# Patient Record
Sex: Female | Born: 1980 | Race: Black or African American
Health system: Southern US, Community
[De-identification: ages and names within clinical notes are randomized; demographics above are authoritative.]

## PROBLEM LIST (undated history)

## (undated) DIAGNOSIS — M2550 Pain in unspecified joint: Secondary | ICD-10-CM

## (undated) DIAGNOSIS — G43909 Migraine, unspecified, not intractable, without status migrainosus: Secondary | ICD-10-CM

## (undated) DIAGNOSIS — F32A Depression, unspecified: Secondary | ICD-10-CM

## (undated) HISTORY — DX: Pain in unspecified joint: M25.50

## (undated) HISTORY — DX: Depression, unspecified: F32.A

## (undated) HISTORY — DX: Migraine, unspecified, not intractable, without status migrainosus: G43.909

---

## 2016-01-22 ENCOUNTER — Encounter (INDEPENDENT_AMBULATORY_CARE_PROVIDER_SITE_OTHER): Payer: Self-pay | Admitting: Nurse Practitioner

## 2016-01-22 ENCOUNTER — Ambulatory Visit (INDEPENDENT_AMBULATORY_CARE_PROVIDER_SITE_OTHER): Payer: BC Managed Care – PPO | Admitting: Nurse Practitioner

## 2016-01-22 ENCOUNTER — Ambulatory Visit (INDEPENDENT_AMBULATORY_CARE_PROVIDER_SITE_OTHER): Payer: BC Managed Care – PPO

## 2016-01-22 VITALS — BP 138/87 | HR 80 | Temp 99.1°F | Resp 14 | Ht 63.0 in | Wt 188.0 lb

## 2016-01-22 DIAGNOSIS — R0789 Other chest pain: Secondary | ICD-10-CM

## 2016-01-22 DIAGNOSIS — R079 Chest pain, unspecified: Secondary | ICD-10-CM

## 2016-01-22 MED ORDER — ALBUTEROL SULFATE HFA 108 (90 BASE) MCG/ACT IN AERS
2.0000 | INHALATION_SPRAY | Freq: Four times a day (QID) | RESPIRATORY_TRACT | Status: AC | PRN
Start: 2016-01-22 — End: ?

## 2016-01-22 MED ORDER — PREDNISONE 20 MG PO TABS
40.0000 mg | ORAL_TABLET | Freq: Every day | ORAL | Status: AC
Start: 2016-01-22 — End: 2016-01-27

## 2016-01-22 MED ORDER — ALBUTEROL SULFATE (2.5 MG/3ML) 0.083% IN NEBU
2.5000 mg | INHALATION_SOLUTION | Freq: Once | RESPIRATORY_TRACT | Status: AC
Start: 2016-01-22 — End: 2016-01-22
  Administered 2016-01-22: 2.5 mg via RESPIRATORY_TRACT

## 2016-01-22 NOTE — Patient Instructions (Signed)
Bronchospasm (Adult)    Bronchospasm occurs when the airways (bronchial tubes) go into spasm and contract. This makes it hard to breathe and causes wheezing (a high-pitched whistling sound). Bronchospasm can also cause frequent coughing without wheezing.  Bronchospasm is due to irritation, inflammation, or allergic reaction of the airways. People with asthma get bronchospasm. However, not everyone with bronchospasm has asthma.  Being exposed to harmful fumes, a recent case of bronchitis, exercise, or a flare-up of chronic obstructive pulmonary disease (COPD) may cause the airways to spasm. An episode of bronchospasm may last 7 to 14 days. Medicine may be prescribed to relax the airways and prevent wheezing. Antibiotics will be prescribed only if your healthcare provider thinks there is a bacterial infection. Antibiotics do not help a viral infection.  Home care   Drink lots of water or other fluids (at least 10 glasses a day) during an attack. This will loosen lung secretions and make it easier to breathe. If you have heart or kidney disease, check with your doctor before you drink extra fluids.   Take prescribed medicine exactly at the times advised. If you take an inhaled medicine to help with breathing, do not use it more than once every 4 hours, unless told to do so. If prescribed an antibiotic or prednisone, take all of the medicine, even if you are feeling better after a few days.   Do not smoke. Also avoid being exposed to secondhand smoke.   If you were given an inhaler, use it exactly as directed. If you need to use it more often than prescribed, your condition may be getting worse. Contact your healthcare provider.  Follow-up care  Follow up with your healthcare provider, or as advised.  Note: If you are age 65 or older, have a chronic lung disease or condition that affects your immune system, or you smoke, we recommend getting pneumococcal vaccinations, as well as an influenza vaccination (flu shot)  every autumn. Ask your healthcare provider about this.  When to seek medical advice  Call your healthcare provider right away if any of these occur:   You need to use your inhalers more often than usual.   You develop a fever of 100.4F (38C) or higher.   You are coughing up lots of dark-colored sputum (mucus).   You do not start to improve within 24 hours.  Call 911, or get immediate medical care  Contact emergency services if any of these occur:   Coughing up bloody sputum (mucus)   Chest pain with each breath   Increased wheezing or shortness of breath  Date Last Reviewed: 07/14/2014   2000-2016 [employer] [address], [place] [address]. All rights reserved. This information is not intended as a substitute for professional medical care. Always follow your healthcare professional's instructions.

## 2016-01-22 NOTE — Progress Notes (Signed)
[facility]  PROGRESS NOTE     Patient: Stephanie Hurley   Date: 01/22/2016   MRN: [id number]       Stephanie Hurley is a 35 y.o. female      SUBJECTIVE     Chief Complaint   Patient presents with   . Chest Pain     Left chest pain for a week, spread to the Right lateral side and Right shoulder uncomfortable. Spit off w/ blood few days ago.   Has had chest pain for a few weeks. Painful to cough and move. Tender to touch. Breath feels short sometimes. Also has a cough that has been for a week. No hx of blood clots or clotting disorders. No recent long travel. No asthma. No previous lung issues/PNA. Non smoker. Patient just got out of the [employer] recently. [place]      Chest Pain   This is a new problem. The current episode started 1 to 4 weeks ago. The onset quality is undetermined. The problem occurs intermittently. The problem has been gradually worsening. The pain is present in the substernal region and lateral region. The pain is moderate. The pain radiates to the left shoulder, right shoulder, upper back and mid back. Associated symptoms include a cough and shortness of breath. Pertinent negatives include no diaphoresis, dizziness, exertional chest pressure, fever, irregular heartbeat, lower extremity edema, nausea, near-syncope, palpitations, sputum production, syncope or vomiting. She has tried nothing for the symptoms. The treatment provided no relief.       Review of Systems   Constitutional: Negative for fever, chills, diaphoresis and fatigue.   Respiratory: Positive for cough, chest tightness and shortness of breath. Negative for sputum production.    Cardiovascular: Positive for chest pain. Negative for palpitations, syncope and near-syncope.   Gastrointestinal: Negative for nausea and vomiting.   Neurological: Negative for dizziness.   All other systems reviewed and are negative.      The following portions of the patient's history were reviewed and  updated as appropriate: Allergies, Current Medications, Past Family History, Past Medical history, Past social history, Past surgical history, and Problem List.    OBJECTIVE     Vitals   Filed Vitals:    01/22/16 1807   BP: 138/87   Pulse: 80   Temp: 99.1 F (37.3 C)   Resp: 14   Height: 1.6 m (5\' 3" )   Weight: 85.276 kg (188 lb)       Physical Exam   Constitutional: She is oriented to person, place, and time. She appears well-developed and well-nourished. No distress.   HENT:   Right Ear: External ear normal.   Left Ear: External ear normal.   Mouth/Throat: Oropharynx is clear and moist.   Eyes: Conjunctivae are normal. Right eye exhibits no discharge. Left eye exhibits no discharge.   Neck: Neck supple.   Cardiovascular: Normal rate, regular rhythm and normal heart sounds.    Pulmonary/Chest: Effort normal and breath sounds normal. No respiratory distress. She has no wheezes. She has no rales. She exhibits tenderness.       Lymphadenopathy:     She has no cervical adenopathy.   Neurological: She is alert and oriented to person, place, and time.   Skin: Skin is warm and dry. She is not diaphoretic.   Psychiatric: She has a normal mood and affect.       Lab Results (24 Hour)   Results     **  No results found for the last 24 hours. **          Radiology Results (24 Hour)     Procedure Component Value Units Date/Time    X-ray chest PA and lateral [[id number]] Collected:  01/22/16 1958    Order Status:  Completed Updated:  01/22/16 2003    Narrative:      HISTORY: chest pain/cough    TECHNIQUE: PA and lateral views of the chest were obtained.     COMPARISON: None.    FINDINGS:  The lungs are clear without focal consolidation. There are no  pleural effusions.  The cardiomediastinal contours are within normal  limits.  No pneumothorax is seen.       Impression:       No evidence of acute cardiopulmonary abnormality.    Gustavus Messing, MD   01/22/2016 7:59 PM            ASSESSMENT     Encounter Diagnoses   Name Primary?   .  Chest pain, unspecified type Yes   . Chest tightness         PLAN     Procedures    1. Chest pain, unspecified type  - Task for ECG    Albuterol neb given here - patient reports improvement in symptoms   CD of images given  Albuterol PRN  Prednisone for 5 days  Follow up with PCP - names given  ECG - copy given, NSR, no ectopy, no ST elevation   An After Visit Summary was printed and given to the patient.      Signed,  Herschel Senegal, NP  01/22/2016

## 2016-03-01 ENCOUNTER — Inpatient Hospital Stay: Payer: Non-veteran care

## 2016-03-01 VITALS — BP 128/81 | HR 61

## 2016-03-01 DIAGNOSIS — M25561 Pain in right knee: Secondary | ICD-10-CM | POA: Insufficient documentation

## 2016-03-01 DIAGNOSIS — M25562 Pain in left knee: Secondary | ICD-10-CM | POA: Insufficient documentation

## 2016-03-01 NOTE — PT/OT Plan of Care (Signed)
Plan of Care IPTC Medicare Provider #: [phone]                Patient Name: Stephanie Hurley  MRN: [id number]  DOI: Onset of Problem / Injury: 02/25/16 DOS: N/A  SOC:03/01/2016    Diagnosis:     ICD-10-CM    1. Acute pain of both knees M25.561     M25.562        ASSESSMENT: the patient is a 35 y.o. female presenting with bilateral knee pain R>L who requires Physical Therapy for the following:  Impairments:   Observation of posture: Deficits noted: Pes Planus bilateral  Gait: decreased hip extension bilateral and decreased knee flexion with swing phase bilateral, ER on L  Functional Strength:   Sit to Stand: Independent  Squat:  Partial; pain on lateral L knee    Balance:  SLS R: Eyes Open (EO): 12 Sec. Burning in posterior knee   SLS L: Eyes Open (EO): 10 sec.  Initial   R   R LE Strength  MMT /5 Initial  L    L   4  Hip Flexion 4+    4+  Hip Extension 4+    4  Hip Abduction 4+    4-  Hip Adduction 4+    4  Quadriceps 4    4  Hamstrings 4    (blank fields were intentionally left blank)  Muscle shaking in quads and hamstrings  Range of Motion: (degrees)  Initial Right  AROM InitialRight  PROM   Right  AROM   Right PROM Knee InitialLeft AROM InitialLeft PROM   Left AROM   Left PROM   120    Flexion 110      3 deg hyper    Extension 5 deg flex      (blank fields were intentionally left blank)  Palpation: Pain to palpation: rectus femoris, vastus lateralis, vastus medialis, iliopsoas, adductors   Patellar Mobility: Within Functional Limits Direction: superior, inferior, medial and lateral  **Tenderness to medial joint line bilaterally  Flexibility:    Comment:   Hamstrings Restricted Bilateral R:59 from neutral p! In R hip  L: 42 from neutral (as measured in 90/90)   Gastroc Restricted Bilateral          Barriers to Rehabilitations/Comorbidities/personal  factors:  Time since onset of injury/illness/exacerbation h/o knee pain since 2013  Comorbidities labral tear of R hip    Pain located: Bilateral knees,  R>L    Clinical presentation: stable     Functional Limitations (PLOF):She is currently having clicking and popping in her knees (R>L) when walking and running and when sitting or getting up from a chair (no discomfort when walking 1+ miles, sitting for 3+ hrs).  Patient is avoiding stairs right now because it is too painful (negotiating stairs everyday).  She is also avoiding wearing heels at this time because it is too painful on her R knee (wearing heels regularly).         Plan Of Care: Body Mechanics Education, NMR, Proprioceptive Activites, Instruction in HEP, Ultrasound, Therapeutic Exercise, Balance/Gait training and Soft Tissue/Joint Mobilization tib-fem gr 1-4 in all directions as needed    Frequency/Duration: 2 times a week for 16 sessions. Certification Status Ends: 05/28/16    Goals:  Date (Body Area, Impairment Goal, Functional   Activity, Target Performance) Time Frame Status Date/  Initial   03/01/2016   Patient will demonstrate independence in prescribed HEP with proper form, sets and reps for safe  discharge to an independent program.  16 sessions Initial Eval    03/01/2016   Increase single leg stance to 30 seconds so patient can negotiate curbs and uneven surfaces safely.  16 sessions Initial Eval    03/01/2016   Increase hip abduction and extensor strength 5/5 to allow for ambulation > 60 minutes.  16 sessions Initial Eval    03/01/2016   Increase knee flexion AROM to 120 degrees bilaterally to allow patient to safely negotiate stairs.  16 sessions Initial Eval    03/01/2016 Patient will improve hamstring flexibility to lacking 40 degrees from neutral or less bilaterally in order to decrease pain when sitting down.  16   sessions Initial Eval      Signature: Leanne Lovely, PT, DPT [place] [facility] Date: 03/04/2016    Signature: Otis Dials, MD _______________________  Date:  ________________     Patient Name: Stephanie Hurley  MRN: [id number]

## 2016-03-01 NOTE — PT/OT Exercise Plan (Signed)
Name: Freddi Che  Referring Physician: Otis Dials, MD  Diagnosis:     ICD-10-CM    1. Acute pain of both knees M25.561     M25.562         Precautions:  H/O labral tear of R hip Date of Surgery:   N/A MD Follow-up: TBD          Exercise Flow Sheet    Exercise Specifics 03/01/2016               Warm Up Treadmill >             Active HS stretch    >               Hip flexor stretch  >             Quad stretch    >                                                                                                                                    Home Exercise Program                 (Initials = supervised exercise by clinician)

## 2016-03-01 NOTE — PT/OT Therapy Note (Signed)
INITIAL EVALUATION (Knee)    Name: Stephanie Hurley Age: 35 y.o. Occupation: Buyer, retail SOC:03/01/2016  Referring Physician: Otis Dials, MD MD recheck: 05/25/16 DOS:  N/A DOI: Onset of Problem / Injury: 02/25/16  # of Authorized Visits: 12 Visit # 1    Diagnosis (Treating/Medical):     ICD-10-CM    1. Acute pain of both knees M25.561     M25.562         SUBJECTIVE: Patient recalls that she landed wrong on her R leg when performing a paratrooper jump back in 2013 which she felt her R leg twisted wrong.  She is currently having clicking and popping in her knees (R>L) when walking and running and when sitting or getting up from a chair.  Patient is avoiding stairs right now because it is too painful.  She is also avoiding wearing heels at this time because it is too painful on her R knee.  Patient had increased swelling in both legs when driving 3.5 hours last weekend which went away the next day when she woke up.  She had pain around her ankles when she had the swelling.  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        Mechanism of Injury: Paratrooper jump landing    Patient's reason for seeking PT /Functional Limitations (PLOF): See POC    Past Medical History:   Past Medical History   Diagnosis Date   . Depression    . Migraine      Medications: No outpatient prescriptions have been marked as taking for the 03/01/16 encounter (Clinical Support) with Leanne Lovely, PT.        Other Treatment/Prior Therapy: Yes, in 2014  Prior Hospitalization: No  Hand Dominance: Dominant Hand: Right Involved Side: Involved Side: Right   DiagnosticTests: X-rays of bilateral knees last week    Outcome Measure:               LEFS L Score: 36 LEFS R Score: 36                        % Pain Score: 70% Rate Satisfaction with Current Function: 3/10   Living Environment: Type of Residence: Multi-story home      Dwelling Entrance:     Patient lives with: Living Arrangements: Spouse/significant  other    OBJECTIVE:    Vitals: BP: 128/81 mmHg Heart Rate: 61      Observation/Posture/Gait/Integumentary:  Observation of posture: Deficits noted: Pes Planus bilateral  Ambulation: without AD  Gait: decreased hip extension bilateral and decreased knee flexion with swing phase bilateral, ER on L  Functional Strength:   Sit to Stand: Independent  Squat:  Partial; pain on lateral L knee    Balance:  SLS R: Eyes Open (EO): 12 Sec. Burning in posterior knee   SLS L: Eyes Open (EO): 10 sec.     Range of Motion: (degrees)  Initial Right  AROM InitialRight  PROM   Right  AROM   Right PROM Knee InitialLeft AROM InitialLeft PROM   Left AROM   Left PROM   120    Flexion 110      3 deg hyper    Extension 5 deg flex      (blank fields were intentionally left blank)    Hip AROM: WFL and Limitations Right hip flexion laterally deviated due to labral tear  Ankle AROM: [facility]    Initial  R   R LE Strength  MMT /5 Initial  L    L   4  Hip Flexion 4+    4+  Hip Extension 4+    4  Hip Abduction 4+    4-  Hip Adduction 4+    4  Quadriceps 4    4  Hamstrings 4      Ankle Dorsiflexion       Ankle Plantarflexion     (blank fields were intentionally left blank)  Muscle shaking in quads and hamstrings  Girth/Edema: None noted   Initial R  Initial L   Suprapatellar      Mid Patellar      Infrapatellar      10 cm. Prox.      10 cm. Dist.      (blank fields were intentionally left blank)    Integumentary: No wound, lesion or rash noted  Palpation: Pain to palpation: rectus femoris, vastus lateralis, vastus medialis, iliopsoas, adductors   Patellar Mobility: Within Functional Limits Direction: superior, inferior, medial and lateral  **Tenderness to medial joint line bilaterally    Flexibility:    Comment:   Hamstrings Restricted Bilateral R:59 from neutral p! In R hip  L: 42 from neutral (as measured in 90/90)   Quadriceps NT    Piriformis NT    ITBand NT    Iliopsoas NT    Gastroc Restricted Bilateral        Special Tests/Neurological Screen:      R L  R L   Lachmans NT NT Apley's Test NT NT   Anterior Drawer NT NT Thomas Test NT NT   Posterior Drawer NT NT Obers Test NT NT   Valgus Stress NT NT SLR NT NT   Varus Stress NT NT Thessalys (-) (-)   McMurray's Test NT NT          Deep Tendon Reflex R L   L3-L4 Patella NT NT   S1 Achilles NT NT    NT NT     Sensation to Light touch: Intact    Treatment Initial Visit:  Evaluation   Patient Education on the goals of physical therapy, focusing on increasing muscle strength in order to decrease muscle imbalances and increasing flexibility.   Therapeutic exercise with instruction in HEP and provided patient written and illustrated handout No  Therapeutic Activity: N/A  Manual: N/A  Modalities: None  Rehab Potential:good  Is patient aware of diagnosis: Yes  For Next Visit Add Assess Thomas test, manual therapy to quads and hamstrings; continue assessing medial joint line, start Therex    Leanne Lovely, PT, DPT [place] [facility]  03/02/2016    Total Treatment (billable) Time:  10'  Total Timed Minutes:  0'

## 2016-03-02 ENCOUNTER — Inpatient Hospital Stay: Payer: Non-veteran care

## 2016-03-02 ENCOUNTER — Inpatient Hospital Stay: Payer: BC Managed Care – PPO

## 2016-03-02 DIAGNOSIS — M25561 Pain in right knee: Secondary | ICD-10-CM | POA: Insufficient documentation

## 2016-03-02 DIAGNOSIS — M25562 Pain in left knee: Secondary | ICD-10-CM | POA: Insufficient documentation

## 2016-03-02 NOTE — PT/OT Therapy Note (Signed)
DAILY NOTE   03/02/2016        Total Treatment (billable)Time: 45'  Total Timed Minutes:  60' Visit Number:  2/16    Payor: OUT OF STATE BLUE CROSS / Plan: BCBS OUT OF STATE / Product Type: *No Product type* /    # of Authorized Visits: 12 Visit #: 1      Diagnosis (Treating/Medical):     ICD-10-CM    1. Acute pain of both knees M25.561     M25.562            Subjective:  Iza's pain is Constant/continuous and Increases with movement and is rated a 3/10.  Functional Status: Pt states she still has pain in her knees.  She said she was just evaluated yesterday, so no real changes, as of yet.    Objective:   Treatment:  Therapeutic Exercise: to improve: Flexibility/ROM, Stabilization and Strength   Warm-up:  4' on bike for B knee ROM and prepare for MT  Modifications/Patient Education: Instructed pt with HEP and provided handout per [employer] for active HS stretches and standing IT band stretches. Verbal cues for correct body alignment with IT band stretches.  Attempted supine IT band stretch but this aggravated her knee pain.    Precautions:   Date of Surgery:    MD Follow-up:           Exercise Flow Sheet    Exercise Specifics 03/01/2016 03/02/16            Bike     4'  pc            Active HS stretches     3x15"  bilat  pc                 Supine and Standing  IT Band stretches  3x15"  Bilat  pc                                                                                                                                                    Home Exercise Program     Provided HEP handout  pc            (Initials = supervised exercise by clinician)        NMR:  None    Therapeutic Activities:  None      Manual Therapy:   Long sit:   STM/DTM to bilat quads, lateral knee retinaculum, and patellar tendons   Patellar mobs  Side Lying (each):   DTM to TFL, gluts, and IT bands      Initial Evaluation Reference and/orCurrent Measurements (ROM, Strength, Girth, Outcomes, etc.):   03/02/16: None, second session       Modalities:  Ice Pack 10 min. Location bilat knees Position Seated  Therapy Rationale: Decrease Pain, Decrease Inflammation, Decrease Edema and Increase  Extensiblility       Assessment (response to treatment):   Increased tone of bilateral hips and IT bands with TTP along the lateral knee joint line and at the inferior patellar tendon with L>R.  Hypomobile patellar. Focused on tissue tightness today but pt will benefit from stability and strengthening of bilateral hips and knees.    Progress towards functional goals: Second session, goals are ongoing.    Frequency/Duration: 2 times a week for 16 sessions. Certification Status Ends: 05/28/16    Goals:  Date (Body Area, Impairment Goal, Functional   Activity, Target Performance) Time Frame Status Date/  Initial   03/01/2016   Patient will demonstrate independence in prescribed HEP with proper form, sets and reps for safe discharge to an independent program.  16 sessions Initial Eval    03/01/2016   Increase single leg stance to 30 seconds so patient can negotiate curbs and uneven surfaces safely.  16 sessions Initial Eval    03/01/2016   Increase hip abduction and extensor strength 5/5 to allow for ambulation > 60 minutes.  16 sessions Initial Eval    03/01/2016   Increase knee flexion AROM to 120 degrees bilaterally to allow patient to safely negotiate stairs.  16 sessions Initial Eval    03/01/2016 Patient will improve hamstring flexibility to lacking 40 degrees from neutral or less bilaterally in order to decrease pain when sitting down.  16   sessions Initial Eval        Patient requires continued skilled care to: improve functional mobility and stability for walking, rec stair negotiation, and transitional movements with decreased pain.    Plan:  Continue with Plan of Care    Evlyn Kanner. [place], [place] [id number]  03/02/2016        EVAL INFO:  SUBJECTIVE: Patient recalls that she landed wrong on her R leg when performing a paratrooper jump back in 2013 which she felt her R leg  twisted wrong.  She is currently having clicking and popping in her knees (R>L) when walking and running and when sitting or getting up from a chair.  Patient is avoiding stairs right now because it is too painful.  She is also avoiding wearing heels at this time because it is too painful on her R knee.  Patient had increased swelling in both legs when driving 3.5 hours last weekend which went away the next day when she woke up.  She had pain around her ankles when she had the swelling.  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        Observation/Posture/Gait/Integumentary:  Observation of posture: Deficits noted: Pes Planus bilateral  Ambulation: without AD  Gait: decreased hip extension bilateral and decreased knee flexion with swing phase bilateral, ER on L    Balance:  SLS R: Eyes Open (EO): 12 Sec. Burning in posterior knee   SLS L: Eyes Open (EO): 10 sec.     Range of Motion: (degrees)  Initial Right  AROM InitialRight  PROM   Right  AROM   Right PROM Knee InitialLeft AROM InitialLeft PROM   Left AROM   Left PROM   120    Flexion 110      3 deg hyper    Extension 5 deg flex      (blank fields were intentionally left blank)    Hip AROM: WFL and Limitations Right hip flexion laterally deviated due to labral tear  Ankle AROM: [facility]    Initial  R   R LE Strength  MMT /5 Initial  L    L   4  Hip Flexion 4+    4+  Hip Extension 4+    4  Hip Abduction 4+    4-  Hip Adduction 4+    4  Quadriceps 4    4  Hamstrings 4      Ankle Dorsiflexion       Ankle Plantarflexion     (blank fields were intentionally left blank)      Flexibility:    Comment:   Hamstrings Restricted Bilateral R:59 from neutral p! In R hip  L: 42 from neutral (as measured in 90/90)   Quadriceps NT    Piriformis NT    ITBand NT    Iliopsoas NT    Gastroc Restricted Bilateral        Special Tests/Neurological Screen:     R L  R L   Lachmans NT NT Apley's Test NT NT   Anterior Drawer NT NT [employer] NT NT   Posterior Drawer NT NT Obers Test  NT NT   Valgus Stress NT NT SLR NT NT   Varus Stress NT NT Thessalys (-) (-)   McMurray's Test NT NT

## 2016-03-02 NOTE — PT/OT Exercise Plan (Signed)
Name: Stephanie Hurley  Referring Physician: Otis Dials, MD  Diagnosis:     ICD-10-CM    1. Acute pain of both knees M25.561     M25.562         Precautions:   Date of Surgery:    MD Follow-up:           Exercise Flow Sheet    Exercise Specifics 03/01/2016 03/02/16            Bike     4'  pc            Active HS stretches     3x15"  bilat  pc                 Supine and Standing  IT Band stretches  3x15"  Bilat  pc                                                                                                                                                    Home Exercise Program                 (Initials = supervised exercise by clinician)

## 2016-03-09 ENCOUNTER — Inpatient Hospital Stay: Payer: Non-veteran care

## 2016-03-09 DIAGNOSIS — M25562 Pain in left knee: Secondary | ICD-10-CM | POA: Insufficient documentation

## 2016-03-09 DIAGNOSIS — M25561 Pain in right knee: Secondary | ICD-10-CM
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
## 2016-03-11 ENCOUNTER — Inpatient Hospital Stay: Payer: Non-veteran care

## 2016-03-11 DIAGNOSIS — M25561 Pain in right knee: Secondary | ICD-10-CM | POA: Insufficient documentation

## 2016-03-11 DIAGNOSIS — M25562 Pain in left knee: Secondary | ICD-10-CM | POA: Insufficient documentation
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
## 2016-03-16 ENCOUNTER — Inpatient Hospital Stay: Payer: Non-veteran care

## 2016-03-16 DIAGNOSIS — M25562 Pain in left knee: Secondary | ICD-10-CM | POA: Insufficient documentation

## 2016-03-16 DIAGNOSIS — M25561 Pain in right knee: Secondary | ICD-10-CM
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
## 2016-03-18 ENCOUNTER — Inpatient Hospital Stay: Payer: Non-veteran care

## 2016-03-18 DIAGNOSIS — M25562 Pain in left knee: Secondary | ICD-10-CM | POA: Insufficient documentation

## 2016-03-18 DIAGNOSIS — M25561 Pain in right knee: Secondary | ICD-10-CM | POA: Insufficient documentation

## 2016-03-18 NOTE — PT/OT Therapy Note (Signed)
DAILY NOTE   03/18/2016        Total Treatment (billable)Time: 45'  Total Timed Minutes:  44' Visit Number:  6/16    Payor: [employer] / Plan: [employer] / Product Type: *No Product type* /    # of Authorized Visits: 12 Visit #: 5      Diagnosis (Treating/Medical):     ICD-10-CM    1. Acute pain of both knees M25.561     M25.562            Subjective:  Stephanie Hurley states she continues to have in both knees that is Constant/continuous and Increases with movement. Right knee is worse at 6/10 with pain on the anterior and posterior aspects; and left 410.  Pt states her entire body feels achy today. Having increased pain in both feet but she started rolling a tennis ball under her feet and this seemed to help.  Pt has an appointment with a podiatrist in June.  Functional Status: Pt reports increased pain after increased activity with an evacuation drill at work today, using the stairs to evacuate and walking a long distance for the meet-up point.      Objective:   Treatment:  Therapeutic Exercise: to improve: Flexibility/ROM, Stabilization and Strength   Warm-up:  Held due to increased pain  Modifications/Patient Education: Progressed gym therex with Verbal cues for correct body alignment and core activation.        Precautions:   Date of Surgery:    MD Follow-up:           Exercise Flow Sheet    Exercise Specifics 03/01/2016 03/02/16 03/09/16 03/11/16 03/16/16 03/18/16        Bike     4'  pc 5'  pc 6'  pc 5'  pc Held        Active HS stretches     3x15"  bilat  pc 2x30"  pc TA activation with march and LAQ  1' ea  pc Quarter wall slides with RTB  At hips and green ball at back  10x5"  pc Sit to stand  RTB at thighs  10x  pc             Supine and Standing  IT Band stretches  3x15"  Bilat  pc Standing  2x30"  pc Tandem walking  1x Hall  pc Tandem walking  1x Hall  pc Tandem walking  1x Hall  pc              S/L clams  10x bilat  pc 15x bilat  pc > Supine clams   RTB  10x5"  pc              Side  Stepping  2x Hall  pc 2x Hall  pc RTB  1x Hall  pc >              Rocker board  1'  pc   1'  pc 1' ea  pc 1' ea  pc              3-way vectors  5x5"  bilat  pc   3-way vectors  5x5"  bilat  pc 3-way vectors  5x5"  bilat  pc >               LP   65#  10x  pc LP  70#  2x10  pc >  6"  Step-ups  10x bilat  pc >                                          Home Exercise Program     Provided HEP handout  pc            (Initials = supervised exercise by clinician)      NMR:     Stability and proprioception training with rocker board.   TA activation with neutral spine position for core stability while performing all therex    Therapeutic Activities:  None    Manual Therapy:   Long sit:   STM/DTM to bilat quads, lateral knee retinaculum, and patellar tendons   Patellar mobs  Prone:   STM/DFM  bilat TFL, gluts, and IT bands   STM to right HS     Initial Evaluation Reference and/orCurrent Measurements (ROM, Strength, Girth, Outcomes, etc.):     Outcomes  Initial Eval  03/18/16    LEFS  Right 36%;Left 36% Right 41%; Left 39%   PSFS 70% Right 53%; Left 40%   Satisfaction 3/10 6/10   HEP Participation         Range of Motion: (degrees)  Initial Right  AROM InitialRight  PROM 03/16/16  Right  AROM   Right PROM Knee InitialLeft AROM InitialLeft PROM   Left AROM   Left PROM   120  145  Flexion 110  137    3 deg hyper  0  Extension 5 deg flex  2    (blank fields were intentionally left blank)      03/09/16:  Timed SLS:  Right 45" (was 12"); Left 45" (was 10")    Modalities: Ice Pack 10 min. Location bilat knees Position Seated  Therapy Rationale: Decrease Pain, Decrease Inflammation, Decrease Edema and Increase Extensiblility       Assessment (response to treatment):   Improved outcomes since initial eval.  Pt continues to have increased bilateral knee pain with right > left.Ongoing TFL and IT band tightness, bilaterally.  Due to increased pain, she had difficulty with several CKC exercises.  Noted antalgic gait with  decreased stance phase on the RLE.    Progress towards functional goals: Improved outcomes.    Frequency/Duration: 2 times a week for 16 sessions. Certification Status Ends: 05/28/16    Goals:  Date (Body Area, Impairment Goal, Functional   Activity, Target Performance) Time Frame Status Date/  Initial   03/01/2016   Patient will demonstrate independence in prescribed HEP with proper form, sets and reps for safe discharge to an independent program.  16 sessions Initial Eval    03/01/2016   Increase single leg stance to 30 seconds so patient can negotiate curbs and uneven surfaces safely.  16 sessions Met 03/09/16  PC   03/01/2016   Increase hip abduction and extensor strength 5/5 to allow for ambulation > 60 minutes.  16 sessions Initial Eval    03/01/2016   Increase knee flexion AROM to 120 degrees bilaterally to allow patient to safely negotiate stairs.  16 sessions ProgressingR 145 and L 137 03/16/16  PC   03/01/2016 Patient will improve hamstring flexibility to lacking 40 degrees from neutral or less bilaterally in order to decrease pain when sitting down.  16   sessions Initial Eval        Patient requires continued skilled care to: improve  functional mobility and stability for walking, rec stair negotiation, and transitional movements with decreased pain.    Plan:  Continue with Plan of Care     Evlyn Kanner. [place], [place] UE#[id number]  03/18/2016        EVAL INFO:  SUBJECTIVE: Patient recalls that she landed wrong on her R leg when performing a paratrooper jump back in 2013 which she felt her R leg twisted wrong.  She is currently having clicking and popping in her knees (R>L) when walking and running and when sitting or getting up from a chair.  Patient is avoiding stairs right now because it is too painful.  She is also avoiding wearing heels at this time because it is too painful on her R knee.  Patient had increased swelling in both legs when driving 3.5 hours last weekend which went away the next day when she woke  up.  She had pain around her ankles when she had the swelling.  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        Observation/Posture/Gait/Integumentary:  Observation of posture: Deficits noted: Pes Planus bilateral  Ambulation: without AD  Gait: decreased hip extension bilateral and decreased knee flexion with swing phase bilateral, ER on L    Balance:  SLS R: Eyes Open (EO): 12 Sec. Burning in posterior knee   SLS L: Eyes Open (EO): 10 sec.     Range of Motion: (degrees)  Initial Right  AROM InitialRight  PROM   Right  AROM   Right PROM Knee InitialLeft AROM InitialLeft PROM   Left AROM   Left PROM   120    Flexion 110      3 deg hyper    Extension 5 deg flex      (blank fields were intentionally left blank)    Hip AROM: WFL and Limitations Right hip flexion laterally deviated due to labral tear  Ankle AROM: WFL    Initial   R   R LE Strength  MMT /5 Initial  L    L   4  Hip Flexion 4+    4+  Hip Extension 4+    4  Hip Abduction 4+    4-  Hip Adduction 4+    4  Quadriceps 4    4  Hamstrings 4      Ankle Dorsiflexion       Ankle Plantarflexion     (blank fields were intentionally left blank)      Flexibility:    Comment:   Hamstrings Restricted Bilateral R:59 from neutral p! In R hip  L: 42 from neutral (as measured in 90/90)   Quadriceps NT    Piriformis NT    ITBand NT    Iliopsoas NT    Gastroc Restricted Bilateral        Special Tests/Neurological Screen:     R L  R L   Lachmans NT NT Apley's Test NT NT   Anterior Drawer NT NT [employer] NT NT   Posterior Drawer NT NT Obers Test NT NT   Valgus Stress NT NT SLR NT NT   Varus Stress NT NT Thessalys (-) (-)   McMurray's Test NT NT

## 2016-03-23 ENCOUNTER — Inpatient Hospital Stay: Payer: Non-veteran care | Admitting: Rehabilitative and Restorative Service Providers"

## 2016-03-23 DIAGNOSIS — M25562 Pain in left knee: Secondary | ICD-10-CM | POA: Insufficient documentation

## 2016-03-23 DIAGNOSIS — M25561 Pain in right knee: Secondary | ICD-10-CM | POA: Insufficient documentation

## 2016-03-23 NOTE — PT/OT Therapy Note (Signed)
DAILY NOTE   03/23/2016        Total Treatment (billable)Time: 50'  Total Timed Minutes:  1' Visit Number:  7/16    Payor: [employer] / Plan: [employer] / Product Type: *No Product type* /    # of Authorized Visits: 12 Visit #: 7      Diagnosis (Treating/Medical):     ICD-10-CM    1. Acute pain of both knees M25.561     M25.562            Subjective:  Stephanie Hurley states she cont. To have pain in her knees L. > R. And its rated 7/10.   Functional Status: Pt reports pain with stairs and squats and the knee is very irritated since evacuation drill and its painful on L. Side.     Objective:   Treatment:  Therapeutic Exercise: to improve: Flexibility/ROM, Stabilization and Strength   Warm-up:  Held due to increased pain  Modifications/Patient Education: Progressed gym therex with Verbal cues for correct body alignment and core activation.        Precautions:   Date of Surgery:    MD Follow-up:           Exercise Flow Sheet    Exercise Specifics 03/01/2016 03/02/16 03/09/16 03/11/16 03/16/16 03/18/16        Bike     4'  pc 5'  pc 6'  pc 5'  pc Held        Active HS stretches     3x15"  bilat  pc 2x30"  pc TA activation with march and LAQ  1' ea  pc Quarter wall slides with RTB  At hips and green ball at back  10x5"  pc Sit to stand  RTB at thighs  10x  pc             Supine and Standing  IT Band stretches  3x15"  Bilat  pc Standing  2x30"  pc Tandem walking  1x Hall  pc Tandem walking  1x Hall  pc Tandem walking  1x Hall  pc              S/L clams  10x bilat  pc 15x bilat  pc > Supine clams   RTB  10x5"  pc              Side Stepping  2x Hall  pc 2x Hall  pc RTB  1x Hall  pc >              Rocker board  1'  pc   1'  pc 1' ea  pc 1' ea  pc              3-way vectors  5x5"  bilat  pc   3-way vectors  5x5"  bilat  pc 3-way vectors  5x5"  bilat  pc >               LP   65#  10x  pc LP  70#  2x10  pc >                6"  Step-ups  10x bilat  pc >                                          Home Exercise Program      Provided  HEP handout  pc            (Initials = supervised exercise by clinician)      NMR:     Mc. connel taping at L. Knee to improve patellar alignment and decrease pressure on Lat. Knee.    Therapeutic Activities:  None    Manual Therapy:   Long sit:   STM/DTM to bilat quads, lateral knee retinaculum, and patellar tendons   Patellar mobs in all planes at bilat knee.   Post. Glide at tibia gr 2 on L. Side.   Plunger use at lat. Retinaculum and distal IT band area.       Initial Evaluation Reference and/orCurrent Measurements (ROM, Strength, Girth, Outcomes, etc.):     Outcomes  Initial Eval  03/18/16    LEFS  Right 36%;Left 36% Right 41%; Left 39%   PSFS 70% Right 53%; Left 40%   Satisfaction 3/10 6/10   HEP Participation         Range of Motion: (degrees)  Initial Right  AROM InitialRight  PROM 03/16/16  Right  AROM   Right PROM Knee InitialLeft AROM InitialLeft PROM   Left AROM   Left PROM   120  145  Flexion 110  137    3 deg hyper  0  Extension 5 deg flex  2    (blank fields were intentionally left blank)      03/09/16:  Timed SLS:  Right 45" (was 12"); Left 45" (was 10")    Modalities: Ice Pack 10 min. Location bilat knees Position Seated.  PUS at L. Lat. Aspect of knee 1.0 w/cm2, 50 % duty cycle for 7 mins.   Therapy Rationale: Decrease Pain, Decrease Inflammation, Decrease Edema and Increase Extensiblility       Assessment (response to treatment):   Pt. Has increase in swelling and TTP at L. Lat. Knee and noted External tibial torsion and increase in weight bearing on outer aspect of foot bilat. Compensatory IR at femur bilat. Resulting in increase in strain on outer aspect of knee resulting in increase in pain. POssible lat. Meniscus tear but unable to perform special test due to recent flare up. Improved tissue extensibility noted after MT but cont. To have increase in sensitivity at L. Lat. Knee. Tol. Taping well.    Progress towards functional goals: Improved outcomes.    Frequency/Duration: 2 times  a week for 16 sessions. Certification Status Ends: 05/28/16    Goals:  Date (Body Area, Impairment Goal, Functional   Activity, Target Performance) Time Frame Status Date/  Initial   03/01/2016   Patient will demonstrate independence in prescribed HEP with proper form, sets and reps for safe discharge to an independent program.  16 sessions Initial Eval    03/01/2016   Increase single leg stance to 30 seconds so patient can negotiate curbs and uneven surfaces safely.  16 sessions Met 03/09/16  PC   03/01/2016   Increase hip abduction and extensor strength 5/5 to allow for ambulation > 60 minutes.  16 sessions Initial Eval    03/01/2016   Increase knee flexion AROM to 120 degrees bilaterally to allow patient to safely negotiate stairs.  16 sessions ProgressingR 145 and L 137 03/16/16  PC   03/01/2016 Patient will improve hamstring flexibility to lacking 40 degrees from neutral or less bilaterally in order to decrease pain when sitting down.  16   sessions Initial Eval        Patient requires continued skilled care  to: improve functional mobility and stability for walking, rec stair negotiation, and transitional movements with decreased pain.    Plan:  Continue with Plan of Care check response to taping.    Ilisa Hayworth A. Celine Mans, PT, [place] # [phone]    03/23/2016        EVAL INFO:  SUBJECTIVE: Patient recalls that she landed wrong on her R leg when performing a paratrooper jump back in 2013 which she felt her R leg twisted wrong.  She is currently having clicking and popping in her knees (R>L) when walking and running and when sitting or getting up from a chair.  Patient is avoiding stairs right now because it is too painful.  She is also avoiding wearing heels at this time because it is too painful on her R knee.  Patient had increased swelling in both legs when driving 3.5 hours last weekend which went away the next day when she woke up.  She had pain around her ankles when she had the swelling.  She has a history of a labral tear in  her right hip.        Observation/Posture/Gait/Integumentary:  Observation of posture: Deficits noted: Pes Planus bilateral  Ambulation: without AD  Gait: decreased hip extension bilateral and decreased knee flexion with swing phase bilateral, ER on L    Balance:  SLS R: Eyes Open (EO): 12 Sec. Burning in posterior knee   SLS L: Eyes Open (EO): 10 sec.     Range of Motion: (degrees)  Initial Right  AROM InitialRight  PROM   Right  AROM   Right PROM Knee InitialLeft AROM InitialLeft PROM   Left AROM   Left PROM   120    Flexion 110      3 deg hyper    Extension 5 deg flex      (blank fields were intentionally left blank)    Hip AROM: WFL and Limitations Right hip flexion laterally deviated due to labral tear  Ankle AROM: WFL    Initial   R   R LE Strength  MMT /5 Initial  L    L   4  Hip Flexion 4+    4+  Hip Extension 4+    4  Hip Abduction 4+    4-  Hip Adduction 4+    4  Quadriceps 4    4  Hamstrings 4      Ankle Dorsiflexion       Ankle Plantarflexion     (blank fields were intentionally left blank)      Flexibility:    Comment:   Hamstrings Restricted Bilateral R:59 from neutral p! In R hip  L: 42 from neutral (as measured in 90/90)   Quadriceps NT    Piriformis NT    ITBand NT    Iliopsoas NT    Gastroc Restricted Bilateral        Special Tests/Neurological Screen:     R L  R L   Lachmans NT NT Apley's Test NT NT   Anterior Drawer NT NT [employer] NT NT   Posterior Drawer NT NT Obers Test NT NT   Valgus Stress NT NT SLR NT NT   Varus Stress NT NT Thessalys (-) (-)   McMurray's Test NT NT

## 2016-03-24 ENCOUNTER — Inpatient Hospital Stay: Payer: Non-veteran care

## 2016-03-24 DIAGNOSIS — M25562 Pain in left knee: Secondary | ICD-10-CM | POA: Insufficient documentation

## 2016-03-24 DIAGNOSIS — M25561 Pain in right knee: Secondary | ICD-10-CM

## 2016-03-24 NOTE — PT/OT Therapy Note (Signed)
DAILY NOTE   03/24/2016        Total Treatment (billable)Time: 50'  Total Timed Minutes:  9' Visit Number: 8/16    Payor: [employer] / Plan: [employer]GRAM / Product Type: *No Product type* /    # of Authorized Visits: 12 Visit #: 7      Diagnosis (Treating/Medical):     ICD-10-CM    1. Acute pain of both knees M25.561     M25.562            Subjective:  Javanna rates both knees at 5/10 pain with L>R and pain mostly at the lateral aspects.   Functional Status: Pt could not feel any difference from the knee tape on her left knee and removed it this morning.  She states the pain is slightly better with both knees and feels like the [place] helped.  Pt continues to have pain with walking, squatting and prolonged sitting.     Objective:   Treatment:  Therapeutic Exercise: to improve: Flexibility/ROM, Stabilization and Strength   Warm-up:  Held due to increased pain  Modifications/Patient Education: Progressed gym therex with Verbal cues for correct body alignment and core activation.        Precautions:   Date of Surgery:    MD Follow-up:           Exercise Flow Sheet    Exercise Specifics 03/01/2016 03/02/16 03/09/16 03/11/16 03/16/16 03/18/16 03/24/16       Bike     4'  pc 5'  pc 6'  pc 5'  pc Held Held       Active HS stretches     3x15"  bilat  pc 2x30"  pc TA activation with march and LAQ  1' ea  pc Quarter wall slides with RTB  At hips and green ball at back  10x5"  pc Sit to stand  RTB at thighs  10x  pc No TB  10x  pc            Supine and Standing  IT Band stretches  3x15"  Bilat  pc Standing  2x30"  pc Tandem walking  1x Hall  pc Tandem walking  1x Hall  pc Tandem walking  1x Hall  pc Tandem walking  1x Hall  pc             S/L clams  10x bilat  pc 15x bilat  pc > Supine clams   RTB  10x5"  pc >             Side Stepping  2x Hall  pc 2x Hall  pc RTB  1x Hall  pc > Side stepping  1x Hall  pc             [employer]  1'  pc   1'  pc 1' ea  pc 1' ea  pc 1' ea  pc             3-way  vectors  5x5"  bilat  pc   3-way vectors  5x5"  bilat  pc 3-way vectors  5x5"  bilat  pc > HEP              LP   65#  10x  pc LP  70#  2x10  pc > >               6"  Step-ups  10x bilat  pc > Lateral step overs with 6"  step  10x  pc                                         Home Exercise Program     Provided HEP handout  pc            (Initials = supervised exercise by clinician)      NMR:     Rocker board and tandem walking for proprioception training    Therapeutic Activities:  None    Manual Therapy:   Long sit:   STM/DTM to bilat quads, lateral knee retinaculum, and patellar tendons   Patellar mobs in all planes at bilat knee.   Post. Glide at tibia gr 2 on L. Side.       Initial Evaluation Reference and/orCurrent Measurements (ROM, Strength, Girth, Outcomes, etc.):     Outcomes  Initial Eval  03/18/16    LEFS  Right 36%;Left 36% Right 41%; Left 39%   PSFS 70% Right 53%; Left 40%   Satisfaction 3/10 6/10   HEP Participation         Range of Motion: (degrees)  Initial Right  AROM InitialRight  PROM 03/16/16  Right  AROM   Right PROM Knee InitialLeft AROM InitialLeft PROM 03/16/16  Left AROM   Left PROM   120  145  Flexion 110  137    3 deg hyper  0  Extension 5 deg flex  2    (blank fields were intentionally left blank)      03/09/16:  Timed SLS:  Right 45" (was 12"); Left 45" (was 10")    Modalities: Ice Pack 10 min. Location bilat knees Position Seated.  PUS at L. Lat. Aspect of knee 1.0 w/cm2, 50 % duty cycle for 7 mins.   Therapy Rationale: Decrease Pain, Decrease Inflammation, Decrease Edema and Increase Extensiblility       Assessment (response to treatment):   Ongoing flare-up with swelling and TTP at Bilat lateral knee, L>R. Due to structural external tibial torsion, pt has increased strain on the lateral aspect of her knees, causing inflammation and muscular imbalances. Increased tone of the TFL and IT band bilaterally. Pt was able to perform therex without increased pain today.  She did c/o fatigue at end  of session.    Progress towards functional goals: Functional goals delayed due to flare-up.    Frequency/Duration: 2 times a week for 16 sessions. Certification Status Ends: 05/28/16    Goals:  Date (Body Area, Impairment Goal, Functional   Activity, Target Performance) Time Frame Status Date/  Initial   03/01/2016   Patient will demonstrate independence in prescribed HEP with proper form, sets and reps for safe discharge to an independent program.  16 sessions Initial Eval    03/01/2016   Increase single leg stance to 30 seconds so patient can negotiate curbs and uneven surfaces safely.  16 sessions Met 03/09/16  PC   03/01/2016   Increase hip abduction and extensor strength 5/5 to allow for ambulation > 60 minutes.  16 sessions Initial Eval    03/01/2016   Increase knee flexion AROM to 120 degrees bilaterally to allow patient to safely negotiate stairs.  16 sessions ProgressingR 145 and L 137 03/16/16  PC   03/01/2016 Patient will improve hamstring flexibility to lacking 40 degrees from neutral or less bilaterally in order to decrease pain when sitting down.  16  sessions Initial Eval        Patient requires continued skilled care to: improve functional mobility and stability for walking, rec stair negotiation, and transitional movements with decreased pain.    Plan:  Continue with Plan of Care ; Assess HS flexibility next session.    Stephanie Hurley [place], PT [place]# [id number]    03/24/2016        EVAL INFO:  SUBJECTIVE: Patient recalls that she landed wrong on her R leg when performing a paratrooper jump back in 2013 which she felt her R leg twisted wrong.  She is currently having clicking and popping in her knees (R>L) when walking and running and when sitting or getting up from a chair.  Patient is avoiding stairs right now because it is too painful.  She is also avoiding wearing heels at this time because it is too painful on her R knee.  Patient had increased swelling in both legs when driving 3.5 hours last weekend which  went away the next day when she woke up.  She had pain around her ankles when she had the swelling.  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        Observation/Posture/Gait/Integumentary:  Observation of posture: Deficits noted: Pes Planus bilateral  Ambulation: without AD  Gait: decreased hip extension bilateral and decreased knee flexion with swing phase bilateral, ER on L    Balance:  SLS R: Eyes Open (EO): 12 Sec. Burning in posterior knee   SLS L: Eyes Open (EO): 10 sec.     Range of Motion: (degrees)  Initial Right  AROM InitialRight  PROM   Right  AROM   Right PROM Knee InitialLeft AROM InitialLeft PROM   Left AROM   Left PROM   120    Flexion 110      3 deg hyper    Extension 5 deg flex      (blank fields were intentionally left blank)    Hip AROM: WFL and Limitations Right hip flexion laterally deviated due to labral tear  Ankle AROM: WFL    Initial   R   R LE Strength  MMT /5 Initial  L    L   4  Hip Flexion 4+    4+  Hip Extension 4+    4  Hip Abduction 4+    4-  Hip Adduction 4+    4  Quadriceps 4    4  Hamstrings 4      Ankle Dorsiflexion       Ankle Plantarflexion     (blank fields were intentionally left blank)      Flexibility:    Comment:   Hamstrings Restricted Bilateral R:59 from neutral p! In R hip  L: 42 from neutral (as measured in 90/90)   Quadriceps NT    Piriformis NT    ITBand NT    Iliopsoas NT    Gastroc Restricted Bilateral        Special Tests/Neurological Screen:     R L  R L   Lachmans NT NT Apley's Test NT NT   Anterior Drawer NT NT [employer] NT NT   Posterior Drawer NT NT Obers Test NT NT   Valgus Stress NT NT SLR NT NT   Varus Stress NT NT Thessalys (-) (-)   McMurray's Test NT NT

## 2016-03-31 ENCOUNTER — Inpatient Hospital Stay: Payer: Non-veteran care

## 2016-03-31 DIAGNOSIS — M25561 Pain in right knee: Secondary | ICD-10-CM | POA: Insufficient documentation

## 2016-03-31 DIAGNOSIS — M25562 Pain in left knee: Secondary | ICD-10-CM | POA: Insufficient documentation

## 2016-03-31 NOTE — PT/OT Therapy Note (Signed)
DAILY NOTE   03/31/2016        Total Treatment (billable)Time: 51'  Total Timed Minutes:  53' Visit Number: 9/16    Payor: [employer] / Plan: [employer] / Product Type: *No Product type* /    # of Authorized Visits: 12 Visit #: 9      Diagnosis (Treating/Medical):     ICD-10-CM    1. Acute pain of both knees M25.561     M25.562            Subjective:  Stephanie Hurley is currently having 4/10 pain in bilateral knees on the lateral aspect.  Patient is having the most pain after sitting for 4 hours at work.    Objective:   Treatment:  Therapeutic Exercise: to improve: Flexibility/ROM, Stabilization and Strength   Warm-up:  5 min unassisted  Modifications/Patient Education: Patient educated on performing HS contract/relax, foam rolling, and clamshells with CLX at home.   Progressed gym therex with Verbal cues for correct body alignment and core activation.        Precautions:   Date of Surgery:    MD Follow-up:           Exercise Flow Sheet    Exercise Specifics 03/01/2016 03/02/16 03/09/16 03/11/16 03/16/16 03/18/16 03/24/16 03/31/16      Bike     4'  pc 5'  pc 6'  pc 5'  pc Held Held 5' p!  EP      Active HS stretches     3x15"  bilat  pc 2x30"  pc TA activation with march and LAQ  1' ea  pc Quarter wall slides with RTB  At hips and green ball at back  10x5"  pc Sit to stand  RTB at thighs  10x  pc No TB  10x  pc Active HS stretch  x15 B  EP           Supine and Standing  IT Band stretches  3x15"  Bilat  pc Standing  2x30"  pc Tandem walking  1x Hall  pc Tandem walking  1x Hall  pc Tandem walking  1x Hall  pc Tandem walking  1x Hall  pc Tandem   Fwd/Bwd on airex beam  1' ea  EP            S/L clams  10x bilat  pc 15x bilat  pc > Supine clams   RTB  10x5"  pc > S/L with GTB  x10  B  EP              Side Stepping  2x Hall  pc 2x Hall  pc RTB  1x Hall  pc > Side stepping  1x Hall  pc >            Rocker board  1'  pc   1'  pc 1' ea  pc 1' ea  pc 1' ea  pc Foam Roller  1' VL/ITB  B  EP            3-way  vectors  5x5"  bilat  pc   3-way vectors  5x5"  bilat  pc 3-way vectors  5x5"  bilat  pc > HEP >             LP   65#  10x  pc LP  70#  2x10  pc > > >              6"  Step-ups  10x bilat  pc > Lateral step overs with 6" step  10x  pc >                                        Home Exercise Program     Provided HEP handout  pc            (Initials = supervised exercise by clinician)      NMR:     Tandem walking for proprioception training    Therapeutic Activities:  None    Manual Therapy:   Supine:   DTM: vastus lateralis B   TPR: vastus lateralis with quad set, B   STM: Tibialis anterior on R   TPR: tibialis anterior with DF/PF on R   Contract/relax to hamstrings B       Initial Evaluation Reference and/orCurrent Measurements (ROM, Strength, Girth, Outcomes, etc.):   Hamstring flexibility, see below  Outcomes  Initial Eval  03/18/16    LEFS  Right 36%;Left 36% Right 41%; Left 39%   PSFS 70% Right 53%; Left 40%   Satisfaction 3/10 6/10   HEP Participation     Flexibility:    Comment: 03/31/16   Hamstrings Restricted Bilateral R:59 from neutral p! In R hip  L: 42 from neutral (as measured in 90/90) 50 on R/35 after contract/relax; 32 deg on L/22 after contract/relax   Quadriceps NT     Piriformis NT     ITBand NT     Iliopsoas NT     Gastroc Restricted Bilateral       Range of Motion: (degrees)  Initial Right  AROM InitialRight  PROM 03/16/16  Right  AROM   Right PROM Knee InitialLeft AROM InitialLeft PROM 03/16/16  Left AROM   Left PROM   120  145  Flexion 110  137    3 deg hyper  0  Extension 5 deg flex  2    (blank fields were intentionally left blank)      03/09/16:  Timed SLS:  Right 45" (was 12"); Left 45" (was 10")    Modalities: None,. Pt denied.    Therapy Rationale: Decrease Pain, Decrease Inflammation, Decrease Edema and Increase Extensiblility       Assessment (response to treatment):   Patient with noted hypersensitivity to anterior tib bilaterally which may be due to peroneal nerve irritation, not  responding well to TPR on R so did not perform on L.  Patient with noted tightness in vastus lateralis bilaterally with significant improvement after TPR.  The pain in the bilaterally knees may be driven from quad and hamstring tightness as both were significantly tight and TTP. Patient requiring multiple verbal cues to stay below the hip during foam rolling to lateral quads.    Progress towards functional goals: Functional goals delayed due to flare-up.    Frequency/Duration: 2 times a week for 16 sessions. Certification Status Ends: 05/28/16    Goals:  Date (Body Area, Impairment Goal, Functional   Activity, Target Performance) Time Frame Status Date/  Initial   03/01/2016   Patient will demonstrate independence in prescribed HEP with proper form, sets and reps for safe discharge to an independent program.  16 sessions Progressing 03/31/16  EP   03/01/2016   Increase single leg stance to 30 seconds so patient can negotiate curbs and uneven surfaces safely.  16 sessions Met 03/09/16  PC   03/01/2016  Increase hip abduction and extensor strength 5/5 to allow for ambulation > 60 minutes.  16 sessions Initial Eval    03/01/2016   Increase knee flexion AROM to 120 degrees bilaterally to allow patient to safely negotiate stairs.  16 sessions ProgressingR 145 and L 137 03/16/16  PC   03/01/2016 Patient will improve hamstring flexibility to lacking 40 degrees from neutral or less bilaterally in order to decrease pain when sitting down.  16   sessions Progressing 03/31/16  EP       Patient requires continued skilled care to: improve functional mobility and stability for walking, rec stair negotiation, and transitional movements with decreased pain.    Plan:  Continue with Plan of Care ; Assess MMT next visit, F/U on foam rolling and clamshells with CLX  Leanne Lovely, PT, DPT [place] [phone]

## 2016-04-01 ENCOUNTER — Inpatient Hospital Stay: Payer: Non-veteran care

## 2016-04-06 ENCOUNTER — Inpatient Hospital Stay: Payer: Non-veteran care

## 2016-04-08 ENCOUNTER — Inpatient Hospital Stay: Payer: Non-veteran care

## 2016-04-08 DIAGNOSIS — M25562 Pain in left knee: Secondary | ICD-10-CM

## 2016-04-08 DIAGNOSIS — M25561 Pain in right knee: Secondary | ICD-10-CM | POA: Insufficient documentation

## 2016-04-08 NOTE — PT/OT Therapy Note (Signed)
DAILY NOTE   04/08/2016        Total Treatment (billable)Time: 45'  Total Timed Minutes:  81' Visit Number: 10/16    Payor: [employer] / Plan: [employer] / Product Type: *No Product type* /    # of Authorized Visits: 12 Visit #: 9      Diagnosis (Treating/Medical):     ICD-10-CM    1. Acute pain of both knees M25.561     M25.562            Subjective:  Stephanie Hurley is currently having 3/10 pain in bilateral knees on the lateral aspect. Pt states she has been using the foam roller at home and it really hurts, but she can see some improvement.  She went to the pool and did some exercises and felt really good afterward.    Objective:   Treatment:  Therapeutic Exercise: to improve: Flexibility/ROM, Stabilization and Strength   Warm-up:  5 min unassisted  Modifications/Patient Education: Reviewed HS contract/relax to work on at home.   Progressed gym therex with Verbal cues for correct body alignment and core activation.        Precautions:   Date of Surgery:    MD Follow-up:           Exercise Flow Sheet    Exercise Specifics 03/01/2016 03/02/16 03/09/16 03/11/16 03/16/16 03/18/16 03/24/16 03/31/16 56/8/17     Bike     4'  pc 5'  pc 6'  pc 5'  pc Held Held 5' p!  EP 5'  pc     Active HS stretches     3x15"  bilat  pc 2x30"  pc TA activation with march and LAQ  1' ea  pc Quarter wall slides with RTB  At hips and green ball at back  10x5"  pc Sit to stand  RTB at thighs  10x  pc No TB  10x  pc Active HS stretch  x15 B  EP Dynamic stretches  High knees, butt kicks, Frankies  1 lap ea  pc          Supine and Standing  IT Band stretches  3x15"  Bilat  pc Standing  2x30"  pc Tandem walking  1x Hall  pc Tandem walking  1x Hall  pc Tandem walking  1x Hall  pc Tandem walking  1x Hall  pc Tandem   Fwd/Bwd on airex beam  1' ea  EP Tandem   Fwd/Bwd on airex beam  1' ea  pc           S/L clams  10x bilat  pc 15x bilat  pc > Supine clams   RTB  10x5"  pc > S/L with GTB  x10  B  EP   >           Side Stepping  2x Hall  pc  2x Hall  pc RTB  1x Hall  pc > Side stepping  1x Hall  pc > Stool scoots  1/2 hall and back  pc           [employer]  1'  pc   1'  pc 1' ea  pc 1' ea  pc 1' ea  pc Foam Roller  1' VL/ITB  B  EP Foam Roller  1'  pc           3-way vectors  5x5"  bilat  pc   3-way vectors  5x5"  bilat  pc 3-way vectors  5x5"  bilat  pc > HEP > --            LP   65#  10x  pc LP  70#  2x10  pc > > > >             6"  Step-ups  10x bilat  pc > Lateral step overs with 6" step  10x  pc > 6" lateral step overs  1'  pc                                       Home Exercise Program     Provided HEP handout  pc            (Initials = supervised exercise by clinician)      NMR:     Tandem walking for proprioception training    Therapeutic Activities:  None    Manual Therapy:   Supine:   DTM: vastus lateralis B   STM: Tibialis anterior on R   Patellar mobs B at grade 4  B Side Lying:   DTM to IT band       Initial Evaluation Reference and/orCurrent Measurements (ROM, Strength, Girth, Outcomes, etc.):     Initial   R 6/8  R LE Strength  MMT /5 Initial  L 6/8   L   4 5 Hip Flexion 4+ 5   4+ 4+ Hip Extension 4+ 5   4 5  Hip Abduction 4+ 5   4- 5 Hip Adduction 4+ 5   4 5  Quadriceps 4 5   4 4  Hamstrings 4 4+     Ankle Dorsiflexion       Ankle Plantarflexion     (blank fields were intentionally left blank)        Hamstring flexibility, see below  Outcomes  Initial Eval  03/18/16    LEFS  Right 36%;Left 36% Right 41%; Left 39%   PSFS 70% Right 53%; Left 40%   Satisfaction 3/10 6/10   HEP Participation     Flexibility:    Comment: 03/31/16   Hamstrings Restricted Bilateral R:59 from neutral p! In R hip  L: 42 from neutral (as measured in 90/90) 50 on R/35 after contract/relax; 32 deg on L/22 after contract/relax   Quadriceps NT     Piriformis NT     ITBand NT     Iliopsoas NT     Gastroc Restricted Bilateral       Range of Motion: (degrees)  Initial Right  AROM InitialRight  PROM 03/16/16  Right  AROM   Right PROM Knee InitialLeft AROM InitialLeft PROM  03/16/16  Left AROM   Left PROM   120  145  Flexion 110  137    3 deg hyper  0  Extension 5 deg flex  2    (blank fields were intentionally left blank)      03/09/16:  Timed SLS:  Right 45" (was 12"); Left 45" (was 10")    Modalities: Ice Pack 10 min. Location bilat knees Position Seated in long sit  Therapy Rationale: Decrease Pain, Decrease Inflammation, Decrease Edema and Increase Extensiblility       Assessment (response to treatment):   Pt presents with lower pain level today.  Noted less sensitivity to touch at the lateral knee joints and along the IT band.  There was less tone at the IT band, as  well.  Pt has improved strength per MMT today.      Progress towards functional goals: Improved strength and standing tolerance.    Frequency/Duration: 2 times a week for 16 sessions. Certification Status Ends: 05/28/16    Goals:  Date (Body Area, Impairment Goal, Functional   Activity, Target Performance) Time Frame Status Date/  Initial   03/01/2016   Patient will demonstrate independence in prescribed HEP with proper form, sets and reps for safe discharge to an independent program.  16 sessions Progressing 03/31/16  EP   03/01/2016   Increase single leg stance to 30 seconds so patient can negotiate curbs and uneven surfaces safely.  16 sessions Met 03/09/16  PC   03/01/2016   Increase hip abduction and extensor strength 5/5 to allow for ambulation > 60 minutes.  16 sessions Progress with L met; R at 4+ 04/08/16  PC   03/01/2016   Increase knee flexion AROM to 120 degrees bilaterally to allow patient to safely negotiate stairs.  16 sessions ProgressingR 145 and L 137 03/16/16  PC   03/01/2016 Patient will improve hamstring flexibility to lacking 40 degrees from neutral or less bilaterally in order to decrease pain when sitting down.  16   sessions Progressing 03/31/16  EP       Patient requires continued skilled care to: improve functional mobility and stability for walking, rec stair negotiation, and transitional movements  with decreased pain.    Plan:  Continue with Plan of Care ; Will continue for 1-2 more sessions and D/C due to end of authorization period.    Evlyn Kanner April Colter, PT # [phone]

## 2016-04-13 ENCOUNTER — Inpatient Hospital Stay: Payer: Non-veteran care

## 2016-04-13 DIAGNOSIS — M25561 Pain in right knee: Secondary | ICD-10-CM

## 2016-04-13 DIAGNOSIS — M25562 Pain in left knee: Secondary | ICD-10-CM | POA: Insufficient documentation

## 2016-04-13 NOTE — PT/OT Therapy Note (Signed)
DAILY NOTE   04/13/2016        Total Treatment (billable)Time: 45'  Total Timed Minutes:  22' Visit Number: 11/16    Payor: [employer] / Plan: [employer] / Product Type: *No Product type* /    # of Authorized Visits: 12 Visit #: 10      Diagnosis (Treating/Medical):     ICD-10-CM    1. Acute pain of both knees M25.561     M25.562            Subjective:  Stephanie Hurley is currently having 4/10 pain in bilateral knees on the lateral aspect. Pt reports increased soreness after [place] 3 hours at the fair this past weekend.  Pt states she feels better overall.  She has been doing her HEP consistently, per pt.  Pt has an appointment with a rheumatologist on Thursday.      Objective:   Treatment:  Therapeutic Exercise: to improve: Flexibility/ROM, Stabilization and Strength   Warm-up:  4 min on ET with good tolerance.  Modifications/Patient Education:  Progressed gym therex with Verbal cues for correct body alignment and core activation.        Precautions:   Date of Surgery:    MD Follow-up:           Exercise Flow Sheet    Exercise Specifics 03/01/2016 03/02/16 03/09/16 03/11/16 03/16/16 03/18/16 03/24/16 03/31/16 04/08/16 04/13/16    Bike     4'  pc 5'  pc 6'  pc 5'  pc Held Held 5' p!  EP 5'  pc ET  4'  pc    Active HS stretches     3x15"  bilat  pc 2x30"  pc TA activation with march and LAQ  1' ea  pc Quarter wall slides with RTB  At hips and green ball at back  10x5"  pc Sit to stand  RTB at thighs  10x  pc No TB  10x  pc Active HS stretch  x15 B  EP Dynamic stretches  High knees, butt kicks, Frankies  1 lap ea  pc Dynamic stretches  High knees, butt kicks, Frankies  1 lap at regular pace and 1 lap at quicker pace  pc         Supine and Standing  IT Band stretches  3x15"  Bilat  pc Standing  2x30"  pc Tandem walking  1x Hall  pc Tandem walking  1x Hall  pc Tandem walking  1x Hall  pc Tandem walking  1x Hall  pc Tandem   Fwd/Bwd on airex beam  1' ea  EP Tandem   Fwd/Bwd on airex beam  1' ea  pc >          S/L  clams  10x bilat  pc 15x bilat  pc > Supine clams   RTB  10x5"  pc > S/L with GTB  x10  B  EP   > >          Side Stepping  2x Hall  pc 2x Hall  pc RTB  1x Hall  pc > Side stepping  1x Hall  pc > Stool scoots  1/2 hall and back  pc YTB  Side steps  1x Hall  (P! As pt completedthis ex)  pc          Rocker board  1'  pc   1'  pc 1' ea  pc 1' ea  pc 1' ea  pc Foam Roller  1'  VL/ITB  B  EP Foam Roller  1'  pc Alt forward lunges  15x ea  pc          3-way vectors  5x5"  bilat  pc   3-way vectors  5x5"  bilat  pc 3-way vectors  5x5"  bilat  pc > HEP > -- --           LP   65#  10x  pc LP  70#  2x10  pc > > > > >            6"  Step-ups  10x bilat  pc > Lateral step overs with 6" step  10x  pc > 6" lateral step overs  1'  pc 6" lateral step overs  1'  pc                                      Home Exercise Program     Provided HEP handout  pc            (Initials = supervised exercise by clinician)      NMR:     Dynamic stretches at various paces for flexibility and stability of hips and knees.    Therapeutic Activities:  None    Manual Therapy:   Supine:   DTM: vastus lateralis B   STM: Tibialis anterior on R   Patellar mobs B at grade 4  B Side Lying:   DTM to IT band       Initial Evaluation Reference and/orCurrent Measurements (ROM, Strength, Girth, Outcomes, etc.):     Initial   R 6/8  R LE Strength  MMT /5 Initial  L 6/8   L   4 5 Hip Flexion 4+ 5   4+ 4+ Hip Extension 4+ 5   4 5  Hip Abduction 4+ 5   4- 5 Hip Adduction 4+ 5   4 5  Quadriceps 4 5   4 4  Hamstrings 4 4+     Ankle Dorsiflexion       Ankle Plantarflexion     (blank fields were intentionally left blank)      Outcomes  Initial Eval  03/18/16    LEFS  Right 36%;Left 36% Right 41%; Left 39%   PSFS 70% Right 53%; Left 40%   Satisfaction 3/10 6/10   HEP Participation       Flexibility:    Comment: 03/31/16 04/13/16   Hamstrings Restricted Bilateral R:59 from neutral p! In R hip  L: 42 from neutral (as measured in 90/90) 50 on R/35 after contract/relax; 32 deg on  L/22 after contract/relax Right: -35 deg  Left: -25 deg   Quadriceps NT      Piriformis NT      ITBand NT      Iliopsoas NT      Gastroc Restricted Bilateral        Range of Motion: (degrees)  Initial Right  AROM InitialRight  PROM 03/16/16  Right  AROM 04/13/16  Right AROM Knee InitialLeft AROM InitialLeft PROM 03/16/16  Left AROM 04/13/16  Left AROM   120  145 135 Flexion 110  137 135   3 deg hyper  0 0 Extension 5 deg flex  2 3   (blank fields were intentionally left blank)      03/09/16:  Timed SLS:  Right 45" (was 12"); Left 45" (was 10")  Modalities: Ice Pack 10 min. Location bilat knees Position Seated in long sit  Therapy Rationale: Decrease Pain, Decrease Inflammation, Decrease Edema and Increase Extensiblility       Assessment (response to treatment):   Pt has much less TTP at the lateral aspect of her knees, but she did develop pain on the lateral aspect of the right knee with side stepping with YTB. Bilat knee ROM is WFL and significantly improved since initial evaluation.  HS flexibility slowly improving.    Progress towards functional goals: Improved strength and standing tolerance.    Frequency/Duration: 2 times a week for 16 sessions. Certification Status Ends: 05/28/16    Goals:  Date (Body Area, Impairment Goal, Functional   Activity, Target Performance) Time Frame Status Date/  Initial   03/01/2016   Patient will demonstrate independence in prescribed HEP with proper form, sets and reps for safe discharge to an independent program.  16 sessions Progressing 03/31/16  EP   03/01/2016   Increase single leg stance to 30 seconds so patient can negotiate curbs and uneven surfaces safely.  16 sessions Met 03/09/16  PC   03/01/2016   Increase hip abduction and extensor strength 5/5 to allow for ambulation > 60 minutes.  16 sessions Progress with L met; R at 4+ 04/08/16  PC   03/01/2016   Increase knee flexion AROM to 120 degrees bilaterally to allow patient to safely negotiate stairs.  16 sessions Met 04/13/16  PC    03/01/2016 Patient will improve hamstring flexibility to lacking 40 degrees from neutral or less bilaterally in order to decrease pain when sitting down.  16   sessions Progressing 04/13/16  PC       Patient requires continued skilled care to: improve functional mobility and stability for walking, rec stair negotiation, and transitional movements with decreased pain.    Plan:  Continue with Plan of Care ; D/C at visit 12 with review of HEP and D/C measurements.  Evlyn Kanner Quintin Hjort, PT [place]# [phone]

## 2016-04-15 ENCOUNTER — Inpatient Hospital Stay: Payer: Non-veteran care

## 2016-04-27 ENCOUNTER — Inpatient Hospital Stay: Payer: Non-veteran care

## 2016-04-27 DIAGNOSIS — M25562 Pain in left knee: Secondary | ICD-10-CM | POA: Insufficient documentation

## 2016-04-27 DIAGNOSIS — M25561 Pain in right knee: Secondary | ICD-10-CM | POA: Insufficient documentation

## 2016-04-27 NOTE — PT/OT Plan of Care (Signed)
Discharge Summary  [employer] Medicare Provider #: [id number]                Patient Name: Stephanie Hurley  MRN: [id number]  DOI: Onset of Problem / Injury: 02/25/16 DOS: N/A  SOC:03/01/2016     Diagnosis:     ICD-10-CM    1. Acute pain of both knees M25.561     M25.562      Subjective: Stephanie Hurley still report some pain w/ sitting > 1 hr and walking > 1 mile. States pain is on/off and moderate at times. Describes pain on lateral anterior aspect of B knees.    Initial Evaluation Reference and/orCurrent Measurements (ROM, Strength, Girth, Outcomes, etc.):                Initial     R  6/8   R  06/27   R  LE Strength   MMT /5  Initial   L  6/8    L  06/27   L    4  5  5   Hip Flexion  4+  5  5    4+  4+  5  Hip Extension  4+  5  5    4  5  5   Hip Abduction  4+  5  5    4-  5  5  Hip Adduction  4+  5  5        5   Hip ER      5        5  Hip IR      5    4  5  5   Quadriceps  4  5  5    4  4  5   Hamstrings  4  4+  5          Ankle Dorsiflexion                Ankle Plantarflexion          (blank fields were intentionally left blank)        Outcomes   Initial Eval   03/18/16  04/27/16     LEFS   Right 36%;Left 36%  Right 41%; Left 39%  54%    PSFS  70%  Right 53%; Left 40%  53%    Satisfaction  3/10  6/10  6/10    HEP Participation            Flexibility:      Comment:  03/31/16  04/13/16  04/27/16    Hamstrings  Restricted Bilateral  R:59 from neutral p! In R hip   L: 42 from neutral (as measured in 90/90)  50 on R/35 after contract/relax; 32 deg on L/22 after contract/relax  Right: -35 deg   Left: -25 deg  R 35 from 90/90 before MT   L 30 from 90/90 before MT    B HS 25 deg from 90/90 after MT    Quadriceps  NT            Piriformis  NT            ITBand  NT            Iliopsoas  NT            Gastroc  Restricted Bilateral              Range of Motion: (degrees)  Initial Right   AROM  InitialRight   PROM  03/16/16   Right  AROM  04/13/16   Right AROM  Knee  InitialLeft AROM  InitialLeft PROM  03/16/16   Left AROM  04/13/16    Left AROM    120    145  135  Flexion  110    137  135    3 deg hyper    0  0  Extension  5 deg flex    2  3    (blank fields were intentionally left blank)    Assessment (response to treatment):         Pr demonstrate improved B LE strength.  Still slight decrease HS flexibility R compared to L, but improved after manual.  Overall, improved pain score and functional questioner (LEFS).  Pt has met all goals established at this time for rehab.  Still pending further evaluation, but rheumatologist.  At this point pt is independent w/ HEP should be able to continue w/ HEP on her own.    Progress towards functional goals: See below    Goals:  Date  (Body Area, Impairment Goal, Functional     Activity, Target Performance)  Time Frame  Status  Date/   Initial    03/01/2016   Patient will demonstrate independence in prescribed HEP with proper form, sets and reps for safe discharge to an independent program.   16 sessions  MET  04/27/16    03/01/2016    Increase single leg stance to 30 seconds so patient can negotiate curbs and uneven surfaces safely.   16 sessions  Met  03/09/16   PC    03/01/2016   Increase hip abduction and extensor strength 5/5 to allow for ambulation > 60 minutes.   16 sessions  MET  04/27/16   GB    03/01/2016   Increase knee flexion AROM to 120 degrees bilaterally to allow patient to safely negotiate stairs.   16 sessions  Met  04/13/16   PC    03/01/2016  Patient will improve hamstring flexibility to lacking 40 degrees from neutral or less bilaterally in order to decrease pain when sitting down.  16     sessions  MET  04/27/16      Plan:  D/C w/ HEP    Signature: [name]. [place], [place] [id number] Date: 04/27/2016    Signature: Otis Dials, MD _______________________  Date:  ________________     Patient Name: Stephanie Hurley  MRN: [id number]

## 2016-04-27 NOTE — PT/OT Therapy Note (Signed)
DAILY NOTE   04/27/2016        Total Treatment (billable)Time: 20 min (arrived 25 min late) Total Timed Minutes:  20 min Visit Number: 12    Payor: [employer] / Plan: [employer] / Product Type: *No Product type* /    # of Authorized Visits: 12 Visit #: 11      Diagnosis (Treating/Medical):     ICD-10-CM    1. Acute pain of both knees M25.561     M25.562            Subjective:  Stephanie Hurley still report some pain w/ sitting > 1 hr and walking > 1 mile.  States pain is on/off and moderate at times.  Describes pain on lateral anterior aspect of B knees.      Objective:   Treatment:  Therapeutic Exercise: to improve: Flexibility/ROM, Stabilization and Strength   Warm-up: None  Modifications/Patient Education:      Reviewed HEP verbally    Precautions:   Date of Surgery:   None MD Follow-up: 04/25/16 ortho, 04/19/16 PCP          Exercise Flow Sheet    Exercise Specifics 03/01/2016 03/02/16 03/09/16 03/11/16 03/16/16 03/18/16 03/24/16 03/31/16 04/08/16 04/13/16    Bike     4'  pc 5'  pc 6'  pc 5'  pc Held Held 5' p!  EP 5'  pc ET  4'  pc    Active HS stretches     3x15"  bilat  pc 2x30"  pc TA activation with march and LAQ  1' ea  pc Quarter wall slides with RTB  At hips and green ball at back  10x5"  pc Sit to stand  RTB at thighs  10x  pc No TB  10x  pc Active HS stretch  x15 B  EP Dynamic stretches  High knees, butt kicks, Frankies  1 lap ea  pc Dynamic stretches  High knees, butt kicks, Frankies  1 lap at regular pace and 1 lap at quicker pace  pc         Supine and Standing  IT Band stretches  3x15"  Bilat  pc Standing  2x30"  pc Tandem walking  1x Hall  pc Tandem walking  1x Hall  pc Tandem walking  1x Hall  pc Tandem walking  1x Hall  pc Tandem   Fwd/Bwd on airex beam  1' ea  EP Tandem   Fwd/Bwd on airex beam  1' ea  pc >          S/L clams  10x bilat  pc 15x bilat  pc > Supine clams   RTB  10x5"  pc > S/L with GTB  x10  B  EP   > >          Side Stepping  2x Hall  pc 2x Hall  pc RTB  1x Hall  pc > Side  stepping  1x Hall  pc > Stool scoots  1/2 hall and back  pc YTB  Side steps  1x Hall  (P! As pt completedthis ex)  pc          Rocker board  1'  pc   1'  pc 1' ea  pc 1' ea  pc 1' ea  pc Foam Roller  1' VL/ITB  B  EP Foam Roller  1'  pc Alt forward lunges  15x ea  pc          3-way  vectors  5x5"  bilat  pc   3-way vectors  5x5"  bilat  pc 3-way vectors  5x5"  bilat  pc > HEP > -- --           LP   65#  10x  pc LP  70#  2x10  pc > > > > >            6"  Step-ups  10x bilat  pc > Lateral step overs with 6" step  10x  pc > 6" lateral step overs  1'  pc 6" lateral step overs  1'  pc                                      Home Exercise Program     Provided HEP handout  pc            (Initials = supervised exercise by clinician)      NMR:  None    Therapeutic Activities:  None    Manual Therapy:   Assessment as below  Supine:   HS stretch w/ C/R       Initial Evaluation Reference and/orCurrent Measurements (ROM, Strength, Girth, Outcomes, etc.):     Initial   R 6/8  R 06/27  R LE Strength  MMT /5 Initial  L 6/8   L 06/27  L   4 5 5  Hip Flexion 4+ 5 5   4+ 4+ 5 Hip Extension 4+ 5 5   4 5 5  Hip Abduction 4+ 5 5   4- 5 5 Hip Adduction 4+ 5 5     5  Hip ER   5     5 Hip IR   5   4 5 5  Quadriceps 4 5 5   4 4 5  Hamstrings 4 4+ 5      Ankle Dorsiflexion         Ankle Plantarflexion      (blank fields were intentionally left blank)      Outcomes  Initial Eval  03/18/16 04/27/16    LEFS  Right 36%;Left 36% Right 41%; Left 39% 54%   PSFS 70% Right 53%; Left 40% 53%   Satisfaction 3/10 6/10 6/10   HEP Participation        Flexibility:    Comment: 03/31/16 04/13/16 04/27/16   Hamstrings Restricted Bilateral R:59 from neutral p! In R hip  L: 42 from neutral (as measured in 90/90) 50 on R/35 after contract/relax; 32 deg on L/22 after contract/relax Right: -35 deg  Left: -25 deg R 35 from 90/90 before MT  L 30 from 90/90 before MT    B HS 25 deg from 90/90 after MT   Quadriceps NT       Piriformis NT       ITBand NT       Iliopsoas NT        Gastroc Restricted Bilateral         Range of Motion: (degrees)  Initial Right  AROM InitialRight  PROM 03/16/16  Right  AROM 04/13/16  Right AROM Knee InitialLeft AROM InitialLeft PROM 03/16/16  Left AROM 04/13/16  Left AROM   120  145 135 Flexion 110  137 135   3 deg hyper  0 0 Extension 5 deg flex  2 3   (blank fields were intentionally left blank)      03/09/16:  Timed SLS:  Right 45" (was 12"); Left 45" (was 10")    Modalities: None     Assessment (response to treatment):   Pr demonstrate improved B LE strength.  Still slight decrease HS flexibility R compared to L, but improved after manual.  Overall, improved pain score and functional questioner (LEFS).  Pt has met all goals established at this time for rehab.  Still pending further evaluation, but rheumatologist.  At this point pt is independent w/ HEP should be able to continue w/ HEP on her own.    Progress towards functional goals: See below    Goals:  Date (Body Area, Impairment Goal, Functional   Activity, Target Performance) Time Frame Status Date/  Initial   03/01/2016   Patient will demonstrate independence in prescribed HEP with proper form, sets and reps for safe discharge to an independent program.  16 sessions MET 04/27/16   03/01/2016   Increase single leg stance to 30 seconds so patient can negotiate curbs and uneven surfaces safely.  16 sessions Met 03/09/16  PC   03/01/2016   Increase hip abduction and extensor strength 5/5 to allow for ambulation > 60 minutes.  16 sessions MET 04/27/16  GB   03/01/2016   Increase knee flexion AROM to 120 degrees bilaterally to allow patient to safely negotiate stairs.  16 sessions Met 04/13/16  PC   03/01/2016 Patient will improve hamstring flexibility to lacking 40 degrees from neutral or less bilaterally in order to decrease pain when sitting down.  16   sessions MET 04/27/16         Plan:  D/C w/ HEP      Beverly Ferner M. [place], [place] [id number]  04/27/2016

## 2016-11-23 ENCOUNTER — Inpatient Hospital Stay: Payer: Non-veteran care

## 2016-11-23 DIAGNOSIS — M5412 Radiculopathy, cervical region: Secondary | ICD-10-CM

## 2016-11-23 NOTE — PT/OT Therapy Note (Signed)
INITIAL EVALUATION (Cervical)    Name: Stephanie Hurley Age: 36 y.o. Occupation: Pharmacist, hospital SOC:11/23/16  Referring Physician: Otis Dials, MD MD recheck: 12/07/16 DOS:   DOI: Onset of Problem / Injury: 11/15/16  # of Authorized Visits: 14 Visit # 2    Diagnosis (Treating/Medical):     ICD-10-CM    1. Cervical radiculopathy M54.12         SUBJECTIVE:  Pt says she has had a history of neck pain and now is having pain in the right shoulder and upper arm with occasional tingling and burning in the last 3 fingers of the right hand.  She reports getting HA when her neck pain is really bad. She says she has difficulty sleeping on her right side due to the pain, avoids lifting and carrying grocery bags and has problems reading or looking down.  Occasionally has difficulty with writing.    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    Patient's reason for seeking PT Functional Limitations (PLOF): See POC    Past Medical History:   Past Medical History:   Diagnosis Date   . Depression    . Migraine    . Pain in joint      Medications: .  sertraline (ZOLOFT) 50 MG tablet   Fall Risks: None    Other Treatment/Prior Therapy: Yes for her knees  Prior Hospitalization: No  Hand Dominance: Dominant Hand: Right Involved Side: Involved Side: Bilateral   DiagnosticTests: MRI is scheduled for 12/07/16    Outcome Measure:                                          38  % Pain Score: 70% Rate Satisfaction with Current Function: 6/10   Living Environment: Type of Residence: One story home/apartment      Dwelling Entrance: # Steps to Enter: 0   Patient lives with: Living Arrangements: Spouse/significant other    OBJECTIVE:    Vitals: BP: 128/81 Heart Rate: 63      Observation/Posture/Gait/Integumentary  Observation: Pt presents with mild muscle guarding with lack of head rotation and elevated shoulders  Posture: Deficits noted: Forward Head, Rounded Shoulders, decreased Cervical  Lordosis , decreased Thoracic Kyphosis, increased Lumbar Lordosis and Scapular Winging Elevated right  Gait: Within functional limits  Integumentary: No wound, lesion or rash noted    AROM: Cervical Spine   Initial      Flexion 38      Extension 54       R L R L   Rotation 74 75     Side Bending 40 45     (blank fields were intentionally left blank)    InitialRight  AROM   Right  AROM Shoulder  WFL and Limitations with pain during movement InitialLeft AROM   Left AROM   145  Flexion 154      Extension     133  Abduction 175      Internal Rotation       External Rotation     (blank fields were intentionally left blank)    Repeated flexion/extension centralizes symptoms? No    STRENGTH  Cervical  MMT /5 IE R IE L R L   C1/2 Neck Flex 5      Neck Ext 5      C3 Neck Sidebend 5  5     Neck Rotation       (blank fields were intentionally left blank)    Initial R   R UE Strength  MMT /5 Initial  L   L   5  Shoulder Flexion 5    5  Shoulder Abduction (C5) 5      Shoulder Extension     4-  Shoulder External Rotation 4-    5  Shoulder Internal Rotation 5      Rhomboids       Serratus       Upper Trapezius (C4)       Middle Trapezius (C4)       Lower Trapezius (C4)     5  Biceps (C6) 5    5  Triceps (C7) 5    5  Wrist Extension (C7) 5    5  Wrist Flexion (C7) 5    (blank fields were intentionally left blank)     Initial   Right Right Dynamometer Strength Initial   Left Left   N/T  Grip N/T      Palmar Pinch  (thumb pad to   finger pad)       Lateral Pinch (thumb pad to lateral surface of index finger)       Tip Pinch (thumb tip to index finger)     (blank fields were intentionally left blank)    Palpation: Pain to palpation: SO, cervical PVM, UT, and LS with L>R with increased muscle tone.     Joint Mobility Assessment: hypomobile cervical and thoracic spine End feel: Firm    Sensation to Light touch: Intact    Special Tests/Neurological Screen:   R  L   Deep Tendon Reflexes: Biceps NT NT    Brachioradialis NT NT    Triceps NT NT      R L   Spurling (-) (-)   Distraction Test (-) (-)   Upper Limb Tension Test (-) NT   Thoracic Outlet NT NT   Deep Neck Flexor Endurance NT NT   Quadrant Test NT NT     Treatment Initial Visit:  Evaluation     Patient Education on Eval findings, POC, initiation of postural training, and HEP  Therapeutic exercise with instruction in HEP and provided patient written and illustrated handout Yes, Medbridge Access Code: [facility]   Therapeutic Activity: N/A  Manual: STM/DTM to SO, cervical and thoracic PVM, UT, LS, rhomboids, and peri-scapular MM with pt in prone position (Unable to tolerate prone pillow.)  Modalities: None  Rehab Potential:good  Is patient aware of diagnosis: Yes  For Next Visit Add Review HEP, MT, continue with postural training, and MOC.    Evlyn Kanner [place], [place] [id number]  11/25/2016    Total Treatment (billable) Time:  59'  Total Timed Minutes:  20'    HEP:  Access Code: [facility]   URL: [email or web address]   Date: 11/23/2016   Prepared by: Allison Quarry     Exercises   Seated Upper Trapezius Stretch - 3 reps - 1 sets - 20 hold - 3x daily - 7x weekly   Seated Levator Scapulae Stretch - 3 reps - 1 sets - 20 hold - 3x daily - 7x weekly

## 2016-11-24 ENCOUNTER — Inpatient Hospital Stay: Payer: Non-veteran care

## 2016-11-24 DIAGNOSIS — M5412 Radiculopathy, cervical region: Secondary | ICD-10-CM | POA: Insufficient documentation

## 2016-11-24 NOTE — PT/OT Therapy Note (Signed)
DAILY NOTE   11/24/2016        Total Treatment (billable)Time: 40   Total Timed Minutes:  40 Visit Number:  2      Payor: [employer] / Plan: [employer]GRAM / Product Type: COMMERCIAL /    # of Authorized Visits: 14 Visit #: 2      Diagnosis (Treating/Medical):     ICD-10-CM    1. Cervical radiculopathy M54.12            Subjective:  Alyn's pain is Constant/continuous and is rated a 5/10 on R UT area.    Functional Status: Pt reports compliance w/ HEP and denies any difficulty.  States it feels good.    Objective:   Treatment:  Therapeutic Exercise: to improve: Flexibility/ROM, Stabilization and Strength   Warm-up:  None in use  Modifications/Patient Education: There ex per flow sheet Verbal, Visual and Tactile cues isolate correct mm    ROM/Manual Stretches L SCM stretch    Access Code: [facility]   URL: [email or web address]   Date: 11/24/2016   Prepared by: Lilli Light     Exercises   Seated Upper Trapezius Stretch - 3 reps - 1 sets - 20 hold - 3x daily - 7x weekly   Seated Levator Scapulae Stretch - 3 reps - 1 sets - 20 hold - 3x daily - 7x weekly   Sternocleidomastoid Stretch - 3 reps - 1 sets - 29 hold - 3x daily - 7x weekly       Exercise Flow Sheet  Exercise Specifics 11/24/16               UBE  >  In use             Prone scap ret     10x5"  GB             Cervical elongation    Supine  10x5"  GB             On Half Foam  Tin Man  [name]  angels    1'  Ea  GB                                                                                                                                    Home Exercise Program                 (all exercises supervised by clinician unless noted otherwise)      NMR:  Postural ex and scapula stability ex per flow sheet.  Ex on the half foam to facilitate increase mobility through T-spine    Therapeutic Activities:  None    Manual Therapy:     Prone w/ face pillow and pillow under chest:  STM/DTM/TPR along UT, Rhomboids and T-spine  paraspinals  Grade II PA along T-spine  MFR along B UT and thoracic paraspinals    Supine:  Laminal  release  SOR  L SCM mm mobilization f/b stretch  STM/DTM along B Pec and Deltoid mm        Initial Evaluation Reference and/orCurrent Measurements (ROM, Strength, Girth, Outcomes, etc.):      AROM: Cervical Spine   Initial      Flexion 38      Extension 54       R L R L   Rotation 74 75     Side Bending 40 45     (blank fields were intentionally left blank)    InitialRight  AROM   Right  AROM Shoulder  WFL and Limitations with pain during movement InitialLeft AROM   Left AROM   145  Flexion 154      Extension     133  Abduction 175      Internal Rotation       External Rotation     (blank fields were intentionally left blank)      STRENGTH  Cervical  MMT /5 IE R IE L R L   C1/2 Neck Flex 5      Neck Ext 5      C3 Neck Sidebend 5 5     Neck Rotation       (blank fields were intentionally left blank)    Initial R   R UE Strength  MMT /5 Initial  L   L   5  Shoulder Flexion 5    5  Shoulder Abduction (C5) 5      Shoulder Extension     4-  Shoulder External Rotation 4-    5  Shoulder Internal Rotation 5      Rhomboids       Serratus       Upper Trapezius (C4)       Middle Trapezius (C4)       Lower Trapezius (C4)     5  Biceps (C6) 5    5  Triceps (C7) 5    5  Wrist Extension (C7) 5    5  Wrist Flexion (C7) 5    (blank fields were intentionally left blank)                    Modalities: None  Therapy Rationale: Other: decline       Assessment (response to treatment):   Pt w/ increase soft tissue restriction B UT and L SCM.  Tolerated first session well w/ focus on manual, pain management and light postural ex.    Progress towards functional goals: See below    Patient requires continued skilled care to: address neck pain, R shoulder pain and limited ROM and strength as above    Plan:  Continue  with Plan of Care    [name]. [place] PT, DPT, [place] [id number]  11/25/2016

## 2016-11-25 NOTE — PT/OT Plan of Care (Signed)
Plan of Care IPTC Medicare Provider #: [phone]                Patient Name: Stephanie Hurley  MRN: [id number]  DOI: Onset of Problem / Injury: 11/15/16 DOS: N/A  SOC:11/23/2016    Diagnosis:     ICD-10-CM    1. Cervical radiculopathy M54.12        ASSESSMENT: the patient is a 36 y.o. female presenting with cervical radiculopathy with RUE pain who requires Physical Therapy for the following:  Impairments: Pt presents with poor posture, limited cervical and right shoulder ROM, muscular imbalances with muscular weakness.  Observation: Pt presents with mild muscle guarding with lack of head rotation and elevated shoulders  Posture: Deficits noted: Forward Head, Rounded Shoulders, decreased Cervical Lordosis , decreased Thoracic Kyphosis, increased Lumbar Lordosis and Scapular Winging Elevated right  Gait: Within functional limits  Integumentary: No wound, lesion or rash noted    AROM: Cervical Spine   Initial      Flexion 38      Extension 54       R L R L   Rotation 74 75     Side Bending 40 45     (blank fields were intentionally left blank)    InitialRight  AROM   Right  AROM Shoulder  WFL and Limitations with pain during movement InitialLeft AROM   Left AROM   145  Flexion 154      Extension     133  Abduction 175      Internal Rotation       External Rotation     (blank fields were intentionally left blank)    Repeated flexion/extension centralizes symptoms? No    STRENGTH  Cervical  MMT /5 IE R IE L R L   C1/2 Neck Flex 5      Neck Ext 5      C3 Neck Sidebend 5 5     Neck Rotation       (blank fields were intentionally left blank)    Initial R   R UE Strength  MMT /5 Initial  L   L   5  Shoulder Flexion 5    5  Shoulder Abduction (C5) 5      Shoulder Extension     4-  Shoulder External Rotation 4-    5  Shoulder Internal Rotation 5      Rhomboids       Serratus       Upper Trapezius (C4)       Middle Trapezius (C4)       Lower Trapezius (C4)     5   Biceps (C6) 5    5  Triceps (C7) 5    5  Wrist Extension (C7) 5    5  Wrist Flexion (C7) 5    (blank fields were intentionally left blank)     Initial   Right Right Dynamometer Strength Initial   Left Left   N/T  Grip N/T      Palmar Pinch  (thumb pad to   finger pad)       Lateral Pinch (thumb pad to lateral surface of index finger)       Tip Pinch (thumb tip to index finger)     (blank fields were intentionally left blank)    Palpation: Pain to palpation: SO, cervical PVM, UT, and LS with L>R with increased muscle tone.     Joint Mobility Assessment: hypomobile cervical  and thoracic spine End feel: Firm    Sensation to Light touch: Intact    Special Tests/Neurological Screen:   R L   Deep Tendon Reflexes: Biceps NT NT    Brachioradialis NT NT    Triceps NT NT      R L   Spurling (-) (-)   Distraction Test (-) (-)   Upper Limb Tension Test (-) NT   Thoracic Outlet NT NT   Deep Neck Flexor Endurance NT NT   Quadrant Test NT NT       Barriers to Rehabilitations/Comorbidities/personal  factors:  Comorbidities with multiple joint pain and areas of pain    Pain located: Posterior HA, neck, right shoulder, upper arm and last 3 fingers of the right hand, and upper back    Clinical presentation: evolving with radicular symptoms    Functional Limitations (PLOF): She says she has difficulty sleeping on her right side due to the pain, avoids lifting and carrying grocery bags and has problems reading or looking down.  Occasionally has difficulty with writing.    Plan Of Care: Body Mechanics Education, NMR, Statistician, Instruction in HEP, Traction, Therapeutic Exercise, Therapeutic Activities, Dry Needling and Soft Tissue/Joint Mobilization at grades 1-4    Frequency/Duration: 2 times a week for 12 sessions. Certification Status Ends: 01/23/17    Goals:  Date (Body Area, Impairment Goal, Functional   Activity, Target Performance) Time Frame Status Date/  Initial   11/23/16   Improve cervical sidebend  to 45 degrees to promote decrease in R UE radicular symptoms so patient may sleep 6 hours.  12 sessions Initial Eval    11/23/16   Increase active cervical rotation to 75 degrees bilaterally for increased visual field to allow safe changing lanes and backing up vehicle while driving with pain < 3/08  12 sessions Initial Eval    11/23/16   Increase mid-trap/lower trap strength 5/5 for maintenance of proper sitting posture needed for computer use > 2 hours.  12 sessions Initial Eval    11/23/16   Patient able to demonstrate correct body mechanics and proper back position during sitting, sleeping and lifting activities to allow resume household chores .  12 sessions Initial Eval    11/23/16   Decrease Neck Disability Index (NDI) to 28% from 38% to exceed Minimal Detectable Change (MDC) of 7 points.  12 sessions Initial Eval      Signature: Evlyn Kanner. [place], [place] [id number] Date: 11/23/2016    Signature: Otis Dials, MD _______________________  Date:  ________________     Patient Name: Stephanie Hurley  MRN: [id number]

## 2016-11-29 ENCOUNTER — Inpatient Hospital Stay: Payer: Non-veteran care | Admitting: Rehabilitative and Restorative Service Providers"

## 2016-11-29 DIAGNOSIS — M5412 Radiculopathy, cervical region: Secondary | ICD-10-CM | POA: Insufficient documentation

## 2016-11-29 NOTE — PT/OT Therapy Note (Signed)
DAILY NOTE   11/29/2016        Total Treatment (billable)Time: 45   Total Timed Minutes:  45  Visit Number:  3      Payor: [employer] / Plan: [employer] / Product Type: COMMERCIAL /    # of Authorized Visits: 14 Visit #: 3      Diagnosis (Treating/Medical):     ICD-10-CM    1. Cervical radiculopathy M54.12          Subjective:  Stephanie Hurley's pain is Constant/continuous and is rated a 3/10 on R UT area.  She hasn't had any pain in (R) UE today.   Functional Status: Pt reports continued difficulty sleeping, but once she takes sleep medication, she is able to sleep through the night.     Objective:   Treatment:  Therapeutic Exercise: to improve: Flexibility/ROM, Stabilization and Strength   Warm-up:  UBE with subjective discussion.   Modifications/Patient Education: There ex per flow sheet Verbal, Visual and Tactile cues isolate correct mm    ROM/Manual Stretches L SCM stretch  Access Code: [email or web address]   URL: [email or web address]   Date: 11/29/2016   Prepared by: Faythe Dingwall     Exercises   Seated Scapular Retraction - 10 reps - 1 sets - 5 hold - 5x daily - 7x weekly   Seated Cervical Retraction - 10 reps - 1 sets - 5 hold - 5x daily - 7x weekly   Shoulder Flexion at Wall - 10 reps - 1 sets - 1x daily - 7x weekly     Access Code: [facility]   URL: [email or web address]   Date: 11/24/2016   Prepared by: Lilli Light     Exercises   Seated Upper Trapezius Stretch - 3 reps - 1 sets - 20 hold - 3x daily - 7x weekly   Seated Levator Scapulae Stretch - 3 reps - 1 sets - 20 hold - 3x daily - 7x weekly   Sternocleidomastoid Stretch - 3 reps - 1 sets - 29 hold - 3x daily - 7x weekly       Exercise Flow Sheet  Exercise Specifics 11/24/16 11/29/16              UBE  >  In use x2'/x2'  JS            Prone scap ret     10x5"  GB 5 Sec x5  JS    With shoulder extension 5 sec x5  JS            Cervical elongation    Supine  10x5"  GB Standing against the wall 5 sec x10  JS            On Half  Foam  Tin Man  [employer]  angels    1'  Ea  GB x1 min each  JS                 Standing against wall chin tuck and scap retraction with shoulder flexion x10  JS  Home Exercise Program                 (all exercises supervised by clinician unless noted otherwise)      NMR:  Postural ex and scapula stability ex per flow sheet.  Verbal, visual, and tactile cues to depress and retract scapula and inhibit UT.     Therapeutic Activities:  None    Manual Therapy:   Prone w/ face pillow and pillow under chest:  STM/DTM/TPR along (B) UT, mid trap, Rhomboids and T-spine paraspinals  Grade II UPA C5-T6    Supine:  STM/MFR (B) c/s extensors, SCM.   SOR  STM/DTM along B Pec and Deltoid mm        Initial Evaluation Reference and/orCurrent Measurements (ROM, Strength, Girth, Outcomes, etc.):      AROM: Cervical Spine   Initial      Flexion 38      Extension 54       R L R L   Rotation 74 75     Side Bending 40 45     (blank fields were intentionally left blank)    InitialRight  AROM   Right  AROM Shoulder  WFL and Limitations with pain during movement InitialLeft AROM   Left AROM   145  Flexion 154      Extension     133  Abduction 175      Internal Rotation       External Rotation     (blank fields were intentionally left blank)      STRENGTH  Cervical  MMT /5 IE R IE L R L   C1/2 Neck Flex 5      Neck Ext 5      C3 Neck Sidebend 5 5     Neck Rotation       (blank fields were intentionally left blank)    Initial R   R UE Strength  MMT /5 Initial  L   L   5  Shoulder Flexion 5    5  Shoulder Abduction (C5) 5      Shoulder Extension     4-  Shoulder External Rotation 4-    5  Shoulder Internal Rotation 5      Rhomboids       Serratus       Upper Trapezius (C4)       Middle Trapezius (C4)       Lower Trapezius  (C4)     5  Biceps (C6) 5    5  Triceps (C7) 5    5  Wrist Extension (C7) 5    5  Wrist Flexion (C7) 5    (blank fields were intentionally left blank)                    Modalities: None  Therapy Rationale: Other: decline       Assessment (response to treatment):   Patient challenged and fatigued with therex progression.   Restricted and tender (R) > (L) UT.   Decreased pressure noted post suboccipital release.     Progress towards functional goals: See below    Patient requires continued skilled care to: address neck pain, R shoulder pain and limited ROM and strength as above    Plan:  Continue with Plan of Care  Reassess c/s AROM    Anderson Malta. Roseanne Reno, PT, DPT, [place] [id number]    11/29/2016   [phone]  Access Code: [email or web address]   URL:  [email or web address]   Date: 11/29/2016   Prepared by: Faythe Dingwall     Exercises   Seated Scapular Retraction - 10 reps - 1 sets - 5 hold - 5x daily - 7x weekly   Seated Cervical Retraction - 10 reps - 1 sets - 5 hold - 5x daily - 7x weekly   Shoulder Flexion at Wall - 10 reps - 1 sets - 1x daily - 7x weekly

## 2016-11-30 ENCOUNTER — Inpatient Hospital Stay: Payer: Non-veteran care

## 2016-12-02 ENCOUNTER — Inpatient Hospital Stay: Payer: Non-veteran care

## 2016-12-02 DIAGNOSIS — M5412 Radiculopathy, cervical region: Secondary | ICD-10-CM

## 2016-12-02 NOTE — PT/OT Therapy Note (Signed)
DAILY NOTE   12/02/2016        Total Treatment (billable)Time: 45'   Total Timed Minutes:  37' ([place])   Visit Number:  4      Payor: [employer] / Plan: [employer] / Product Type: COMMERCIAL /    # of Authorized Visits: 14 Visit #: 4      Diagnosis (Treating/Medical):     ICD-10-CM    1. Cervical radiculopathy M54.12          Subjective:  Stephanie Hurley's pain is Constant/continuous and is rated a 4-5/10   Functional Status: Patient reported still having pain with sleeping on right, and overhead reach/lift.     Objective:   Treatment:  Therapeutic Exercise: to improve: Flexibility/ROM, Stabilization and Strength   Warm-up:  UBE with subjective discussion.   Modifications/Patient Education: There ex per flow sheet Verbal, Visual and Tactile cues isolate correct mm    ROM/Manual Stretches L SCM stretch  Access Code: [email or web address]   URL: [email or web address]   Date: 11/29/2016   Prepared by: Faythe Dingwall     Exercises   Seated Scapular Retraction - 10 reps - 1 sets - 5 hold - 5x daily - 7x weekly   Seated Cervical Retraction - 10 reps - 1 sets - 5 hold - 5x daily - 7x weekly   Shoulder Flexion at Wall - 10 reps - 1 sets - 1x daily - 7x weekly     Access Code: [facility]   URL: [email or web address]   Date: 11/24/2016   Prepared by: Lilli Light     Exercises   Seated Upper Trapezius Stretch - 3 reps - 1 sets - 20 hold - 3x daily - 7x weekly   Seated Levator Scapulae Stretch - 3 reps - 1 sets - 20 hold - 3x daily - 7x weekly   Sternocleidomastoid Stretch - 3 reps - 1 sets - 29 hold - 3x daily - 7x weekly       Exercise Flow Sheet  Exercise Specifics 11/24/16 11/29/16              UBE  >  In use x2'/x2'  JS            Prone scap ret     10x5"  GB 5 Sec x5  JS    With shoulder extension 5 sec x5  JS            Cervical elongation    Supine  10x5"  GB Standing against the wall 5 sec x10  JS            On Half Foam  Tin Man  [employer]  angels    1'  Ea  GB x1 min each  JS                  Standing against wall chin tuck and scap retraction with shoulder flexion x10  JS  Home Exercise Program                 (all exercises supervised by clinician unless noted otherwise)      NMR:    Supine Posterior and Inferior HHD: 5" hold, 2x10 ea     Therapeutic Activities:  None    Manual Therapy:   Supine:   STM/DTM/MFR/TPR: Right Biceps Brachii, Suprapinatus, Levator scap, UT, Rhomboid, Anterior and Middle Deltoid.   Grade 3 Inferior and Posterior humeral head glides   Gentle scap depression stretch        Initial Evaluation Reference and/orCurrent Measurements (ROM, Strength, Girth, Outcomes, etc.):      AROM: Cervical Spine   Initial      Flexion 38      Extension 54       R L R L   Rotation 74 75     Side Bending 40 45     (blank fields were intentionally left blank)    InitialRight  AROM   Right  AROM Shoulder  WFL and Limitations with pain during movement InitialLeft AROM   Left AROM   145  Flexion 154      Extension     133  Abduction 175      Internal Rotation       External Rotation     (blank fields were intentionally left blank)      STRENGTH  Cervical  MMT /5 IE R IE L R L   C1/2 Neck Flex 5      Neck Ext 5      C3 Neck Sidebend 5 5     Neck Rotation       (blank fields were intentionally left blank)    Initial R   R UE Strength  MMT /5 Initial  L   L   5  Shoulder Flexion 5    5  Shoulder Abduction (C5) 5      Shoulder Extension     4-  Shoulder External Rotation 4-    5  Shoulder Internal Rotation 5      Rhomboids       Serratus       Upper Trapezius (C4)       Middle Trapezius (C4)       Lower Trapezius (C4)     5  Biceps (C6) 5    5  Triceps (C7) 5    5  Wrist Extension (C7) 5    5  Wrist Flexion (C7) 5    (blank fields were intentionally left blank)                     Modalities: None  Therapy Rationale: Other: decline       Assessment (response to treatment):   Anterior humeral head position, thus affecting biceps brachii irritation. Multiple cueing needed for NMR humeral posterior and inferior setting. Added Posterior HHD for HEP: at 2x/day, 5" hold, 2x10; in which she demonstrated correct mechanics, after cueing.     Progress towards functional goals: See below    Patient requires continued skilled care to: address neck pain, R shoulder pain and limited ROM and strength as above    Plan:  Continue with Plan of Care. Reassess c/s AROM    [place] DPT, [place] ZO#[id number]  12/02/2016       [phone]  Access Code: [email or web address]   URL: [email or web address]   Date: 11/29/2016   Prepared by: Faythe Dingwall  Exercises   Seated Scapular Retraction - 10 reps - 1 sets - 5 hold - 5x daily - 7x weekly   Seated Cervical Retraction - 10 reps - 1 sets - 5 hold - 5x daily - 7x weekly   Shoulder Flexion at Wall - 10 reps - 1 sets - 1x daily - 7x weekly

## 2016-12-06 ENCOUNTER — Inpatient Hospital Stay: Payer: Non-veteran care

## 2016-12-06 DIAGNOSIS — M5412 Radiculopathy, cervical region: Secondary | ICD-10-CM | POA: Insufficient documentation

## 2016-12-06 NOTE — PT/OT Therapy Note (Signed)
DAILY NOTE   12/06/2016        Total Treatment (billable)Time: 40   Total Timed Minutes:  40 ([place])   Visit Number:  5     Certification Status Ends: 01/23/17    Payor: [employer] / Plan: [employer] / Product Type: COMMERCIAL /    # of Authorized Visits: 14 Visit #: 4      Diagnosis (Treating/Medical):     ICD-10-CM    1. Cervical radiculopathy M54.12          Subjective:  Stephanie Hurley reports no pain on arrival.  States she did have some pain on upper thoracic spine earlier today.  Pt has MRI tomorrow.  Functional Status: Still feels on/off burning pain w/ looking down to read for a long time.    Objective:   Treatment:  Therapeutic Exercise: to improve: Flexibility/ROM, Stabilization and Strength   Warm-up:  UBE with subjective discussion.   Modifications/Patient Education: There ex per flow sheet Verbal, Visual and Tactile cues isolate correct mm    ROM/Manual Stretches B UT  Access Code: [email or web address]   URL: [email or web address]   Date: 11/29/2016   Prepared by: Faythe Dingwall     Exercises   Seated Scapular Retraction - 10 reps - 1 sets - 5 hold - 5x daily - 7x weekly   Seated Cervical Retraction - 10 reps - 1 sets - 5 hold - 5x daily - 7x weekly   Shoulder Flexion at Wall - 10 reps - 1 sets - 1x daily - 7x weekly     Access Code: [facility]   URL: [email or web address]   Date: 11/24/2016   Prepared by: Lilli Light     Exercises   Seated Upper Trapezius Stretch - 3 reps - 1 sets - 20 hold - 3x daily - 7x weekly   Seated Levator Scapulae Stretch - 3 reps - 1 sets - 20 hold - 3x daily - 7x weekly   Sternocleidomastoid Stretch - 3 reps - 1 sets - 29 hold - 3x daily - 7x weekly       Exercise Flow Sheet  Exercise Specifics 11/24/16 11/29/16 12/07/15             UBE  >  In use x2'/x2'  JS x2'/x2'  JS           Prone scap ret     10x5"  GB 5 Sec x5  JS    With shoulder extension 5 sec x5  JS On half foam roll  H ABD RTB x20  GB    B ER RTB x20  GB           Cervical elongation     Supine  10x5"  GB Standing against the wall 5 sec x10  JS Standing against the wall 5 sec x10  GB           On Half Foam  Tin Man  [employer]  angels    1'  Ea  GB x1 min each  JS 1'  Ea  GB                Standing against wall chin tuck and scap retraction with shoulder flexion x10  JS Standing against wall chin tuck and scap retraction with shoulder flexion x10  GB                 Swan RTB x20  GB  Home Exercise Program                 (all exercises supervised by clinician unless noted otherwise)      NMR:    Supine Posterior and Inferior HHD: 5" hold, 2x10 ea     Therapeutic Activities:  None    Manual Therapy:   Supine:   STM/DTM/MFR/TPR: Right Biceps Brachii, Suprapinatus, Levator scap, UT, Rhomboid, Anterior and Middle Deltoid.   Grade 3 Inferior and Posterior humeral head glides   Gentle scap depression stretch        Initial Evaluation Reference and/orCurrent Measurements (ROM, Strength, Girth, Outcomes, etc.):      AROM: Cervical Spine   Initial  12/06/16    Flexion 38  50    Extension 54  60     R L R L   Rotation 74 75 72 65   Side Bending 40 45 40 40   (blank fields were intentionally left blank)    InitialRight  AROM   Right  AROM Shoulder  WFL and Limitations with pain during movement InitialLeft AROM   Left AROM   145  Flexion 154      Extension     133  Abduction 175      Internal Rotation       External Rotation     (blank fields were intentionally left blank)      STRENGTH  Cervical  MMT /5 IE R IE L R L   C1/2 Neck Flex 5      Neck Ext 5      C3 Neck Sidebend 5 5     Neck Rotation       (blank fields were intentionally left blank)    Initial R   R UE Strength  MMT /5 Initial  L   L   5  Shoulder Flexion 5    5  Shoulder Abduction (C5) 5      Shoulder Extension     4-  Shoulder External Rotation 4-    5  Shoulder  Internal Rotation 5      Rhomboids       Serratus       Upper Trapezius (C4)       Middle Trapezius (C4)       Lower Trapezius (C4)     5  Biceps (C6) 5    5  Triceps (C7) 5    5  Wrist Extension (C7) 5    5  Wrist Flexion (C7) 5    (blank fields were intentionally left blank)                    Modalities: None  Therapy Rationale: Other: decline       Assessment (response to treatment):   Improved AROM c-spine flex/ext today, but no improvement w/ SB and rotation.    Progress towards functional goals: See below      Goals:  Date (Body Area, Impairment Goal, Functional   Activity, Target Performance) Time Frame Status Date/  Initial   11/23/16   Improve cervical sidebend to 45 degrees to promote decrease in R UE radicular symptoms so patient may sleep 6 hours.  12 sessions Not met 12/06/16  GB   11/23/16   Increase active cervical rotation to 75 degrees bilaterally for increased visual field to allow safe changing lanes and backing up vehicle while driving with pain < 1/61  12 sessions Not met 12/06/16  GB  11/23/16   Increase mid-trap/lower trap strength 5/5 for maintenance of proper sitting posture needed for computer use > 2 hours.  12 sessions Initial Eval    11/23/16   Patient able to demonstrate correct body mechanics and proper back position during sitting, sleeping and lifting activities to allow resume household chores .  12 sessions Initial Eval    11/23/16   Decrease Neck Disability Index (NDI) to 28% from 38% to exceed Minimal Detectable Change (MDC) of 7 points.  12 sessions Initial Eval        Patient requires continued skilled care to: address neck pain, R shoulder pain and limited ROM and strength as above    Plan:  Continue with Plan of Care.  F/u on MRI next session.    [name]. [place], DPT, [place] [id number]  12/06/2016       [phone]  Access Code: [email or web address]   URL: [email or web address]   Date: 11/29/2016   Prepared by: Faythe Dingwall      Exercises   Seated Scapular Retraction - 10 reps - 1 sets - 5 hold - 5x daily - 7x weekly   Seated Cervical Retraction - 10 reps - 1 sets - 5 hold - 5x daily - 7x weekly   Shoulder Flexion at Wall - 10 reps - 1 sets - 1x daily - 7x weekly

## 2016-12-08 ENCOUNTER — Inpatient Hospital Stay: Payer: Non-veteran care

## 2016-12-08 DIAGNOSIS — M5412 Radiculopathy, cervical region: Secondary | ICD-10-CM | POA: Insufficient documentation

## 2016-12-08 NOTE — PT/OT Therapy Note (Signed)
DAILY NOTE   12/08/2016        Total Treatment (billable)Time: 40   Total Timed Minutes:  40 (Page)   Visit Number:  6/12     Certification Status Ends: 01/23/17    Payor: [employer] / Plan: [employer]GRAM / Product Type: COMMERCIAL /    # of Authorized Visits: 14 Visit #: 6      Diagnosis (Treating/Medical):     ICD-10-CM    1. Cervical radiculopathy M54.12          Subjective:  Stephanie Hurley reports her pain is 4/10 in her neck and upper back, as well as her right shoulder, but the pain is not constant.  She had an MRI yesterday of her neck and right shoulder and hopes to get results by the end of the week.    Functional Status: Pt states she is still having some pain running down her right arm and fingers, mainly the last 2 fingers.     Objective:   Treatment:  Therapeutic Exercise: to improve: Flexibility/ROM, Stabilization and Strength   Warm-up:  UBE with subjective discussion.   Modifications/Patient Education: There ex per flow sheet Verbal, Visual and Tactile cues isolate correct mm    ROM/Manual Stretches B UT  Access Code: [email or web address]   URL: [email or web address]   Date: 11/29/2016   Prepared by: Faythe Dingwall     Exercises   Seated Scapular Retraction - 10 reps - 1 sets - 5 hold - 5x daily - 7x weekly   Seated Cervical Retraction - 10 reps - 1 sets - 5 hold - 5x daily - 7x weekly   Shoulder Flexion at Wall - 10 reps - 1 sets - 1x daily - 7x weekly     Access Code: [facility]   URL: [email or web address]   Date: 11/24/2016   Prepared by: Lilli Light     Exercises   Seated Upper Trapezius Stretch - 3 reps - 1 sets - 20 hold - 3x daily - 7x weekly   Seated Levator Scapulae Stretch - 3 reps - 1 sets - 20 hold - 3x daily - 7x weekly   Sternocleidomastoid Stretch - 3 reps - 1 sets - 29 hold - 3x daily - 7x weekly       Exercise Flow Sheet  Exercise Specifics 11/24/16 11/29/16 12/07/15 12/08/16            UBE  >  In use x2'/x2'  JS x2'/x2'  JS 2'/2'  pc          Prone scap ret      10x5"  GB 5 Sec x5  JS    With shoulder extension 5 sec x5  JS On half foam roll  H ABD RTB x20  GB    B ER RTB x20  GB Standing with back and head at wall  RTB Finning  2x10  pc          Cervical elongation    Supine  10x5"  GB Standing against the wall 5 sec x10  JS Standing against the wall 5 sec x10  GB Standing with back and head at wall  RTB   H Abd  2x10  pc          On Half Foam  Tin Man  [name]  angels    1'  Ea  GB x1 min each  JS 1'  Ea  GB Standing at wall   1' ea  pc  Standing against wall chin tuck and scap retraction with shoulder flexion x10  JS Standing against wall chin tuck and scap retraction with shoulder flexion x10  GB Side lying open book  10x   Bilat  pc                Swan RTB x20  GB RTB  2x10  pc                                                                                               Home Exercise Program                 (all exercises supervised by clinician unless noted otherwise)      NMR:    Supine Posterior and Inferior HHD: 5" hold, 2x10 ea     Therapeutic Activities:  None    Manual Therapy:   Supine:   STM/DTM/MFR/TPR: Right Biceps Brachii, Suprapinatus, Levator scap, UT, Rhomboid, Anterior and Middle Deltoid.   Grade 3 Inferior and Posterior humeral head glides   Gentle scap depression stretch   Ulnar nerve glides        Initial Evaluation Reference and/orCurrent Measurements (ROM, Strength, Girth, Outcomes, etc.):      AROM: Cervical Spine   Initial  12/06/16    Flexion 38  50    Extension 54  60     R L R L   Rotation 74 75 72 65   Side Bending 40 45 40 40   (blank fields were intentionally left blank)    InitialRight  AROM   Right  AROM Shoulder  WFL and Limitations with pain during movement InitialLeft AROM   Left AROM   145  Flexion 154      Extension     133  Abduction 175      Internal Rotation       External Rotation     (blank fields were intentionally left blank)      STRENGTH  Cervical  MMT /5 IE R IE  L R L   C1/2 Neck Flex 5      Neck Ext 5      C3 Neck Sidebend 5 5     Neck Rotation       (blank fields were intentionally left blank)    Initial R   R UE Strength  MMT /5 Initial  L   L   5  Shoulder Flexion 5    5  Shoulder Abduction (C5) 5      Shoulder Extension     4-  Shoulder External Rotation 4-    5  Shoulder Internal Rotation 5      Rhomboids       Serratus       Upper Trapezius (C4)       Middle Trapezius (C4)       Lower Trapezius (C4)     5  Biceps (C6) 5    5  Triceps (C7) 5    5  Wrist Extension (C7) 5    5  Wrist Flexion (C7) 5    (  blank fields were intentionally left blank)                    Modalities: None  Therapy Rationale: Other: decline       Assessment (response to treatment):   Continued with referred pain into the RUE with increased nerve tension with ulnar glides.  Ongoing ST restrictions along the cervical PVM, UT, and LS.  Noted improved postural awareness while performing therex.    Progress towards functional goals: See below      Goals:  Date (Body Area, Impairment Goal, Functional   Activity, Target Performance) Time Frame Status Date/  Initial   11/23/16   Improve cervical sidebend to 45 degrees to promote decrease in R UE radicular symptoms so patient may sleep 6 hours.  12 sessions Not met 12/06/16  GB   11/23/16   Increase active cervical rotation to 75 degrees bilaterally for increased visual field to allow safe changing lanes and backing up vehicle while driving with pain < 1/61  12 sessions Not met 12/06/16  GB   11/23/16   Increase mid-trap/lower trap strength 5/5 for maintenance of proper sitting posture needed for computer use > 2 hours.  12 sessions Initial Eval    11/23/16   Patient able to demonstrate correct body mechanics and proper back position during sitting, sleeping and lifting activities to allow resume household chores .  12 sessions Improved postural awareness  12/08/16  PC   11/23/16   Decrease Neck Disability Index (NDI) to 28% from 38% to exceed Minimal Detectable Change (MDC) of 7 points.  12 sessions Initial Eval        Patient requires continued skilled care to: address neck pain, R shoulder pain and limited ROM and strength as above    Plan:  Continue with Plan of Care.  F/u on MRI next session.    Evlyn Kanner [place], [place]  [place]# [id number]  12/08/2016       [phone]  Access Code: [email or web address]   URL: [email or web address]   Date: 11/29/2016   Prepared by: Faythe Dingwall     Exercises   Seated Scapular Retraction - 10 reps - 1 sets - 5 hold - 5x daily - 7x weekly   Seated Cervical Retraction - 10 reps - 1 sets - 5 hold - 5x daily - 7x weekly   Shoulder Flexion at Wall - 10 reps - 1 sets - 1x daily - 7x weekly

## 2016-12-13 ENCOUNTER — Inpatient Hospital Stay: Payer: Non-veteran care

## 2016-12-15 ENCOUNTER — Inpatient Hospital Stay: Payer: Non-veteran care

## 2016-12-20 ENCOUNTER — Inpatient Hospital Stay: Payer: Non-veteran care

## 2016-12-22 ENCOUNTER — Inpatient Hospital Stay: Payer: Non-veteran care

## 2016-12-22 DIAGNOSIS — M5412 Radiculopathy, cervical region: Secondary | ICD-10-CM

## 2016-12-22 NOTE — PT/OT Therapy Note (Signed)
DAILY NOTE   12/22/2016        Total Treatment (billable)Time: 60'   Total Timed Minutes:  55' ([place])   Visit Number:  7/12     Certification Status Ends: 01/23/17    Payor: [employer] / Plan: [employer] / Product Type: COMMERCIAL /    # of Authorized Visits: 14 Visit #: 6      Diagnosis (Treating/Medical):     ICD-10-CM    1. Cervical radiculopathy M54.12          Subjective:  Stephanie Hurley reports her pain is 6/10 in her neck and upper back, as well as her right shoulder, with a HA today.  Pt brought results of her MRI of neck and right shoulder indicating cervical stenosis and bursitis of the right shoulder.  See full report in media tab.  She saw 2 different doctors over the past week and one told her she may have carpal tunnel and recommended she wear a wrist splint at night.  Functional Status: Pt reports that she has been lifting weights at the gym and working on her stretches.  She is still having some pain running down her right arm and fingers, mainly the last 2 fingers.     Objective:   Treatment:  Therapeutic Exercise: to improve: Flexibility/ROM, Stabilization and Strength   Warm-up:  UBE with subjective discussion.   Modifications: There ex per flow sheet Verbal, Visual and Tactile cues isolate correct mm. Updated HEP with PRE's with instructions to do them in front of mirror to check her form.  Patient Education:  Discussed results of MRI and how muscular imbalances can effect spinal alignment and create impingement on the soft tissue and nervous tissue.  Worked on improving posture with today's NMR.  ROM/Manual Stretches B UT  Access Code: [email or web address]   URL: [email or web address]   Date: 11/29/2016   Prepared by: Faythe Dingwall     Exercises   Seated Scapular Retraction - 10 reps - 1 sets - 5 hold - 5x daily - 7x weekly   Seated Cervical Retraction - 10 reps - 1 sets - 5 hold - 5x daily - 7x weekly   Shoulder Flexion at Wall - 10 reps - 1 sets - 1x daily - 7x weekly      Access Code: [facility]   URL: [email or web address]   Date: 11/24/2016   Prepared by: Lilli Light     Exercises   Seated Upper Trapezius Stretch - 3 reps - 1 sets - 20 hold - 3x daily - 7x weekly   Seated Levator Scapulae Stretch - 3 reps - 1 sets - 20 hold - 3x daily - 7x weekly   Sternocleidomastoid Stretch - 3 reps - 1 sets - 29 hold - 3x daily - 7x weekly     Updated HEP 12/22/16:   BUE PRE's with 1# weight: 2x10 ea  Flexion to 90 deg  Scap to 90 deg  Abduction to 90 deg          Exercise Flow Sheet  Exercise Specifics 11/24/16 11/29/16 12/07/15 12/08/16 12/22/16           UBE  >  In use x2'/x2'  JS x2'/x2'  JS 2'/2'  pc 2'/2'  pc         Prone scap ret     10x5"  GB 5 Sec x5  JS    With shoulder extension 5 sec x5  JS On half foam roll  H ABD RTB x20  GB    B ER RTB x20  GB Standing with back and head at wall  RTB Finning  2x10  pc RTB  Finning  2x10  pc         Cervical elongation    Supine  10x5"  GB Standing against the wall 5 sec x10  JS Standing against the wall 5 sec x10  GB Standing with back and head at wall  RTB   H Abd  2x10  pc >         On Half Foam  Tin Man  [employer]  angels    1'  Ea  GB x1 min each  JS 1'  Ea  GB Standing at wall   1' ea  pc PRE's with 1# flex  scap  abd  10x ea  pc              Standing against wall chin tuck and scap retraction with shoulder flexion x10  JS Standing against wall chin tuck and scap retraction with shoulder flexion x10  GB Side lying open book  10x   Bilat  pc Standing open book  5x alt  bilat  pc               Swan RTB x20  GB RTB  2x10  pc RTB  PD  2x10  pc                                                                                              Home Exercise Program                 (all exercises supervised by clinician unless noted otherwise)      NMR:     Supine Posterior and Inferior HHD: 5" hold, 2x10 ea    Left side lying with scap PD isometric and isotonic resistance per PT   Frequent tactile and verbal cues for scap PD, lumbar spine  in neutral position and TA activation    Therapeutic Activities:  None    Manual Therapy:   Supine:   STM/DTM/MFR/TPR: Right Biceps Brachii, Suprapinatus, Levator scap, UT, Rhomboid, Anterior and Middle Deltoid.   Grade 3 Inferior and Posterior humeral head glides   Gentle scap depression stretch   Ulnar nerve glides        Initial Evaluation Reference and/orCurrent Measurements (ROM, Strength, Girth, Outcomes, etc.):      AROM: Cervical Spine   Initial  12/06/16    Flexion 38  50    Extension 54  60     R L R L   Rotation 74 75 72 65   Side Bending 40 45 40 40   (blank fields were intentionally left blank)    InitialRight  AROM   Right  AROM Shoulder  WFL and Limitations with pain during movement InitialLeft AROM   Left AROM   145  Flexion 154      Extension     133  Abduction 175      Internal Rotation       External Rotation     (  blank fields were intentionally left blank)      STRENGTH  Cervical  MMT /5 IE R IE L R L   C1/2 Neck Flex 5      Neck Ext 5      C3 Neck Sidebend 5 5     Neck Rotation       (blank fields were intentionally left blank)    Initial R   R UE Strength  MMT /5 Initial  L   L   5  Shoulder Flexion 5    5  Shoulder Abduction (C5) 5      Shoulder Extension     4-  Shoulder External Rotation 4-    5  Shoulder Internal Rotation 5      Rhomboids       Serratus       Upper Trapezius (C4)       Middle Trapezius (C4)       Lower Trapezius (C4)     5  Biceps (C6) 5    5  Triceps (C7) 5    5  Wrist Extension (C7) 5    5  Wrist Flexion (C7) 5    (blank fields were intentionally left blank)                    Modalities: None  Therapy Rationale: Other: decline       Assessment (response to treatment):   Pt continues to have ST restrictions along the cervical PVM, UT, and LS, as well as the R rhomboids and pecs.  Positive for nerve tension of the RUE ulnar nerve.  Focused on  NMR with isolation of scap stabilizers to inhibit the UT, which was very challenging for pt. FOTO functional status score today was 44/100; initial was 51/100, indicating a decline in function with severe to moderate level of pain that is constant.  Her NDI score also declined to 46% from 38%.    Progress towards functional goals: See below      Goals:  Date (Body Area, Impairment Goal, Functional   Activity, Target Performance) Time Frame Status Date/  Initial   11/23/16   Improve cervical sidebend to 45 degrees to promote decrease in R UE radicular symptoms so patient may sleep 6 hours.  12 sessions Not met 12/06/16  GB   11/23/16   Increase active cervical rotation to 75 degrees bilaterally for increased visual field to allow safe changing lanes and backing up vehicle while driving with pain < 5/40  12 sessions Not met 12/06/16  GB   11/23/16   Increase mid-trap/lower trap strength 5/5 for maintenance of proper sitting posture needed for computer use > 2 hours.  12 sessions Initial Eval    11/23/16   Patient able to demonstrate correct body mechanics and proper back position during sitting, sleeping and lifting activities to allow resume household chores .  12 sessions Improved postural awareness 12/08/16  PC   11/23/16   Decrease Neck Disability Index (NDI) to 28% from 38% to exceed Minimal Detectable Change (MDC) of 7 points.  12 sessions 46% today with improvement 12/22/16  PC       Patient requires continued skilled care to: address neck pain, R shoulder pain and limited ROM and strength as above    Plan:  Continue with Plan of Care. Continue to focus on improved strength and correct mm activation with postural training.    Evlyn Kanner [place], PT  [place]# [id number]  12/22/2016  [phone]  Access Code: [email or web address]   URL: [email or web address]   Date: 11/29/2016   Prepared by: Faythe Dingwall     Exercises   Seated Scapular Retraction - 10 reps - 1 sets - 5 hold - 5x daily - 7x weekly   Seated  Cervical Retraction - 10 reps - 1 sets - 5 hold - 5x daily - 7x weekly   Shoulder Flexion at Wall - 10 reps - 1 sets - 1x daily - 7x weekly

## 2016-12-27 ENCOUNTER — Inpatient Hospital Stay: Payer: Non-veteran care

## 2016-12-27 DIAGNOSIS — M5412 Radiculopathy, cervical region: Secondary | ICD-10-CM | POA: Insufficient documentation

## 2016-12-27 NOTE — PT/OT Therapy Note (Signed)
DAILY NOTE   12/27/2016        Total Treatment (billable)Time: 45   Total Timed Minutes:  45 ([place])   Visit Number:  8/12     Certification Status Ends: 01/23/17    Payor: [employer] / Plan: [employer]GRAM / Product Type: COMMERCIAL /    # of Authorized Visits: 14 Visit #: 8      Diagnosis (Treating/Medical):     ICD-10-CM    1. Cervical radiculopathy M54.12          Subjective:  Stephanie Hurley reports mild HA on arrival.  Rates it 5/10 on NRS.  C/o tightness  B UT.   Feels it might be related w/ how she sleeps, since it is usually worst first thing in the morning.    Functional Status: States she has been using bottle of water for resistance ex since she does not have small wts.    Objective:   Treatment:  Therapeutic Exercise: to improve: Flexibility/ROM, Stabilization and Strength   Warm-up:  UBE with subjective discussion.   Modifications: There ex per flow sheet Verbal, Visual and Tactile cues isolate correct mm. Updated HEP with PRE's with instructions to do them in front of mirror to check her form.  Patient Education:  Discussed sleeping habits and encouraged to limit her self to 1 pillow at night (vs pt reports sleeping w/ 2 pillows) to support c-spine.        Exercise Flow Sheet  Exercise Specifics 11/24/16 11/29/16 12/07/15 12/08/16 12/22/16 12/27/16          UBE  >  In use x2'/x2'  JS x2'/x2'  JS 2'/2'  pc 2'/2'  pc 2'/2'  GB        Prone scap ret     10x5"  GB 5 Sec x5  JS    With shoulder extension 5 sec x5  JS On half foam roll  H ABD RTB x20  GB    B ER RTB x20  GB Standing with back and head at wall  RTB Finning  2x10  pc RTB  Finning  2x10  pc GTB  Finning  x10  GB    RTB H ABD  x20  GB        Cervical elongation    Supine  10x5"  GB Standing against the wall 5 sec x10  JS Standing against the wall 5 sec x10  GB Standing with back and head at wall  RTB   H Abd  2x10  pc > R shoulder flexion doorframe stretch 2x30"  GB        On Half Foam  Tin Man  [name]  angels    1'  Ea  GB x1 min  each  JS 1'  Ea  GB Standing at wall   1' ea  pc PRE's with 1# flex  scap  abd  10x ea  pc PRE's with 1# flex  scap  abd  10x ea  GB             Standing against wall chin tuck and scap retraction with shoulder flexion x10  JS Standing against wall chin tuck and scap retraction with shoulder flexion x10  GB Side lying open book  10x   Bilat  pc Standing open book  5x alt  bilat  pc >              Swan RTB x20  GB RTB  2x10  pc  RTB  PD  2x10  pc >                                                                                             Home Exercise Program                 (all exercises supervised by clinician unless noted otherwise)      NMR:     Supine Posterior and Inferior HHD: 5" hold, 2x10 ea    Left side lying with scap PD isometric and isotonic resistance per PT   Frequent tactile and verbal cues for scap PD, lumbar spine in neutral position and TA activation    Therapeutic Activities:  None    Manual Therapy:     Prone:   PA along T-spine Grade II-III   MFR B UT and T-spine    Supine:   STM/DTM/MFR/TPR: Right Biceps Brachii, Suprapinatus, Levator scap, UT, Rhomboid, Anterior and Middle Deltoid.   Grade 3 Inferior and Posterior humeral head glides   Gentle scap depression stretch   Ulnar nerve glides        Initial Evaluation Reference and/orCurrent Measurements (ROM, Strength, Girth, Outcomes, etc.):      AROM: Cervical Spine   Initial  12/06/16  12/27/16    Flexion 38  50  40    Extension 54  60  40     R L R L      Rotation 74 75 72 65 60 60   Side Bending 40 45 40 40 40 40   (blank fields were intentionally left blank)    InitialRight  AROM 02/26  Right  AROM Shoulder  WFL and Limitations with pain during movement InitialLeft AROM 02/26  Left AROM   145 125 Flexion 154 140     Extension     133 155 Abduction 175 170     Internal Rotation       External Rotation     (blank fields were intentionally left blank)      STRENGTH  Cervical  MMT /5 IE R IE L R L    C1/2 Neck Flex 5      Neck Ext 5      C3 Neck Sidebend 5 5     Neck Rotation       (blank fields were intentionally left blank)    Initial R   R UE Strength  MMT /5 Initial  L   L   5  Shoulder Flexion 5    5  Shoulder Abduction (C5) 5      Shoulder Extension     4-  Shoulder External Rotation 4-    5  Shoulder Internal Rotation 5      Rhomboids       Serratus       Upper Trapezius (C4)       Middle Trapezius (C4)       Lower Trapezius (C4)     5  Biceps (C6) 5    5  Triceps (C7) 5    5  Wrist Extension (C7) 5    5  Wrist Flexion (C7) 5    (blank fields were intentionally left blank)                    Modalities: None  Therapy Rationale: Other: decline       Assessment (response to treatment):   ROM c-spine has not improved, but symmetrical and pain free within available range.  Still suffering from HA.  Still limited AROM R shoulder flexion and ABD, but full PROM.     Progress towards functional goals: See below      Goals:  Date (Body Area, Impairment Goal, Functional   Activity, Target Performance) Time Frame Status Date/  Initial   11/23/16   Improve cervical sidebend to 45 degrees to promote decrease in R UE radicular symptoms so patient may sleep 6 hours.  12 sessions Not met 12/27/16  GB   11/23/16   Increase active cervical rotation to 75 degrees bilaterally for increased visual field to allow safe changing lanes and backing up vehicle while driving with pain < 1/30  12 sessions Not met 12/27/16  GB   11/23/16   Increase mid-trap/lower trap strength 5/5 for maintenance of proper sitting posture needed for computer use > 2 hours.  12 sessions Initial Eval    11/23/16   Patient able to demonstrate correct body mechanics and proper back position during sitting, sleeping and lifting activities to allow resume household chores .  12 sessions Improved postural awareness 12/08/16  PC   11/23/16   Decrease Neck Disability  Index (NDI) to 28% from 38% to exceed Minimal Detectable Change (MDC) of 7 points.  12 sessions 46% today with improvement 12/22/16  PC       Patient requires continued skilled care to: address neck pain, R shoulder pain and limited ROM and strength as above    Plan:  Continue with Plan of Care. Continue to focus on improved strength and correct mm activation with postural training.    [name]. Howell, DPT, [place] [id number]  12/27/2016         Reference:    [id number]        Access Code: [email or web address]   URL: [email or web address]   Date: 11/29/2016   Prepared by: Faythe Dingwall     Exercises   Seated Scapular Retraction - 10 reps - 1 sets - 5 hold - 5x daily - 7x weekly   Seated Cervical Retraction - 10 reps - 1 sets - 5 hold - 5x daily - 7x weekly   Shoulder Flexion at Wall - 10 reps - 1 sets - 1x daily - 7x weekly     Access Code: [facility]   URL: [email or web address]   Date: 11/24/2016   Prepared by: Lilli Light     Exercises   Seated Upper Trapezius Stretch - 3 reps - 1 sets - 20 hold - 3x daily - 7x weekly   Seated Levator Scapulae Stretch - 3 reps - 1 sets - 20 hold - 3x daily - 7x weekly   Sternocleidomastoid Stretch - 3 reps - 1 sets - 29 hold - 3x daily - 7x weekly     Updated HEP 12/22/16:   BUE PRE's with 1# weight: 2x10 ea  Flexion to 90 deg  Scap to 90 deg  Abduction to 90 deg

## 2016-12-29 ENCOUNTER — Inpatient Hospital Stay: Payer: Non-veteran care

## 2016-12-29 DIAGNOSIS — M5412 Radiculopathy, cervical region: Secondary | ICD-10-CM | POA: Insufficient documentation

## 2016-12-29 NOTE — PT/OT Therapy Note (Addendum)
DAILY NOTE   12/29/2016        Total Treatment (billable)Time: 45   Total Timed Minutes:  45 ([place])   Visit Number:  9/12     Certification Status Ends: 01/23/17    Payor: [employer] / Plan: V[employer]GRAM / Product Type: COMMERCIAL /    # of Authorized Visits: 14 Visit #: 8      Diagnosis (Treating/Medical):     ICD-10-CM    1. Cervical radiculopathy M54.12          Subjective:  Stephanie Hurley says she is still having a HA and some mild dizziness today.  She said her neck and shoulders are feeling better but she was sore in her shoulders after her PT session on Monday.  She said she tried to sleep on one pillow but was not able to get comfortable, so ended up doing her usual routine.  She said she feels like she can't breath when she has only one pillow under her head.  Functional Status: Pt reports no change today.     Objective:   Treatment:  Therapeutic Exercise: to improve: Flexibility/ROM, Stabilization and Strength   Warm-up:  UBE with subjective discussion.   Modifications: There ex per flow sheet Verbal, Visual and Tactile cues isolate correct mm. Updated HEP with PRE's with instructions to do them in front of mirror to check her form.  Patient Education:  Discussed sleeping habits and encouraged to limit her self to 1 pillow at night (vs pt reports sleeping w/ 2 pillows) to support c-spine.        Exercise Flow Sheet  Exercise Specifics 11/24/16 11/29/16 12/07/15 12/08/16 12/22/16 12/27/16 12/29/16         UBE  >  In use x2'/x2'  JS x2'/x2'  JS 2'/2'  pc 2'/2'  pc 2'/2'  GB 2'/2'  pc       Prone scap ret     10x5"  GB 5 Sec x5  JS    With shoulder extension 5 sec x5  JS On half foam roll  H ABD RTB x20  GB    B ER RTB x20  GB Standing with back and head at wall  RTB Finning  2x10  pc RTB   GTB  Finning  x10  GB    RTB H ABD  x20  GB RTB  Finning with back at wall  2x10  pc       Cervical elongation    Supine  10x5"  GB Standing against the wall 5 sec x10  JS Standing against the wall 5 sec x10  GB  Standing with back and head at wall  RTB   H Abd  2x10  pc > R shoulder flexion doorframe stretch 2x30"  GB >       On Half Foam  Tin Man  [employer]  angels    1'  Ea  GB x1 min each  JS 1'  Ea  GB Standing at wall   1' ea  pc PRE's with 1# flex  scap  abd  10x ea  pc PRE's with 1# flex  scap  abd  10x ea  GB RTB 3-way vectors   BUE  5x  pc            Standing against wall chin tuck and scap retraction with shoulder flexion x10  JS Standing against wall chin tuck and scap retraction with shoulder flexion x10  GB Side lying open  book  10x   Bilat  pc Standing open book  5x alt  bilat  pc > Standing open book  5x alt  bilat  pc             Swan RTB x20  GB RTB  2x10  pc RTB  PD  2x10  pc > >                                                                                            Home Exercise Program                 (all exercises supervised by clinician unless noted otherwise)      NMR:     Supine Posterior and Inferior HHD: 5" hold, 2x10 ea    Left side lying with scap PD isometric and isotonic resistance per PT   Frequent tactile and verbal cues for scap PD, lumbar spine in neutral position and TA activation    Therapeutic Activities:     Review of correct sleeping positions with recommendation to try a foam wedge to elevate her upper body to assist with breathing, and use a single pillow for head and neck support    Manual Therapy:     Prone:   PA along T-spine Grade II-III   MFR B UT and T-spine    Supine:   STM/DTM/MFR/TPR: Right Biceps Brachii, Suprapinatus, Levator scap, UT, Rhomboid, Anterior and Middle Deltoid.   Grade 3 Inferior and Posterior humeral head glides   Gentle scap depression stretch   Ulnar nerve glides        Initial Evaluation Reference and/orCurrent Measurements (ROM, Strength, Girth, Outcomes, etc.):      AROM: Cervical Spine   Initial  12/06/16  12/27/16    Flexion 38  50  40    Extension 54  60  40     R L R L      Rotation 74 75 72 65 60 60   Side Bending 40  45 40 40 40 40   (blank fields were intentionally left blank)    InitialRight  AROM 02/26  Right  AROM Shoulder  WFL and Limitations with pain during movement InitialLeft AROM 02/26  Left AROM   145 125 Flexion 154 140     Extension     133 155 Abduction 175 170     Internal Rotation       External Rotation     (blank fields were intentionally left blank)      STRENGTH  Cervical  MMT /5 IE R IE L R L   C1/2 Neck Flex 5      Neck Ext 5      C3 Neck Sidebend 5 5     Neck Rotation       (blank fields were intentionally left blank)    Initial R   R UE Strength  MMT /5 Initial  L   L   5  Shoulder Flexion 5    5  Shoulder Abduction (C5) 5      Shoulder Extension     4-  Shoulder External Rotation 4-    5  Shoulder Internal Rotation 5      Rhomboids       Serratus       Upper Trapezius (C4)       Middle Trapezius (C4)       Lower Trapezius (C4)     5  Biceps (C6) 5    5  Triceps (C7) 5    5  Wrist Extension (C7) 5    5  Wrist Flexion (C7) 5    (blank fields were intentionally left blank)                    Modalities: None  Therapy Rationale: Other: decline       Assessment (response to treatment):   Assessed vertebral artery due to c/o dizziness with negative results.  Ongoing ST restrictions of the upper quarter with UT, LS and SO mm the greatest.  Pt requires frequent tactile and verbal cues for her posture due to UT activation, increased lumbar lordosis and muscle weakness.    Progress towards functional goals: See below      Goals:  Date (Body Area, Impairment Goal, Functional   Activity, Target Performance) Time Frame Status Date/  Initial   11/23/16   Improve cervical sidebend to 45 degrees to promote decrease in R UE radicular symptoms so patient may sleep 6 hours.  12 sessions Not met 12/27/16  GB   11/23/16   Increase active cervical rotation to 75 degrees bilaterally for increased visual field to  allow safe changing lanes and backing up vehicle while driving with pain < 0/98  12 sessions Not met 12/27/16  GB   11/23/16   Increase mid-trap/lower trap strength 5/5 for maintenance of proper sitting posture needed for computer use > 2 hours.  12 sessions Initial Eval    11/23/16   Patient able to demonstrate correct body mechanics and proper back position during sitting, sleeping and lifting activities to allow resume household chores .  12 sessions Improved postural awareness 12/08/16  PC   11/23/16   Decrease Neck Disability Index (NDI) to 28% from 38% to exceed Minimal Detectable Change (MDC) of 7 points.  12 sessions 46% today with improvement 12/22/16  PC       Patient requires continued skilled care to: address neck pain, R shoulder pain and limited ROM and strength as above    Plan:  Continue with Plan of Care. Continue to focus on improved strength and correct mm activation with postural training.    Evlyn Kanner [place], [place]  [place]# [id number]  12/29/2016         Reference:    [phone]        Access Code: [email or web address]   URL: [email or web address]   Date: 11/29/2016   Prepared by: Faythe Dingwall     Exercises   Seated Scapular Retraction - 10 reps - 1 sets - 5 hold - 5x daily - 7x weekly   Seated Cervical Retraction - 10 reps - 1 sets - 5 hold - 5x daily - 7x weekly   Shoulder Flexion at Wall - 10 reps - 1 sets - 1x daily - 7x weekly     Access Code: [facility]   URL: [email or web address]   Date: 11/24/2016   Prepared by: Lilli Light     Exercises   Seated Upper Trapezius Stretch - 3 reps - 1 sets - 20 hold - 3x daily - 7x weekly   Seated Levator Scapulae  Stretch - 3 reps - 1 sets - 20 hold - 3x daily - 7x weekly   Sternocleidomastoid Stretch - 3 reps - 1 sets - 29 hold - 3x daily - 7x weekly     Updated HEP 12/22/16:   BUE PRE's with 1# weight: 2x10 ea  Flexion to 90 deg  Scap to 90 deg  Abduction to 90 deg

## 2017-01-03 ENCOUNTER — Inpatient Hospital Stay: Payer: Non-veteran care

## 2017-01-03 DIAGNOSIS — M5412 Radiculopathy, cervical region: Secondary | ICD-10-CM

## 2017-01-03 NOTE — PT/OT Therapy Note (Signed)
DAILY NOTE   01/03/2017        Total Treatment (billable)Time: 40  Total Timed Minutes:  40 ([place])   Visit Number:  10/12     Certification Status Ends: 01/23/17    Payor: [employer] / Plan: [employer] / Product Type: COMMERCIAL /    # of Authorized Visits: 14 Visit #: 10      Diagnosis (Treating/Medical):     ICD-10-CM    1. Cervical radiculopathy M54.12          Subjective:  Stephanie Hurley report no HA or dizziness today.  Does report she feels like she is starting to get sick and has general soreness.    Functional Status: No HA today.  Pt was able to tolerated a drive to [place] and back over the weekend.    Objective:   Treatment:  Therapeutic Exercise: to improve: Flexibility/ROM, Stabilization and Strength   Warm-up:  UBE with subjective discussion.   Modifications: There ex per flow sheet Verbal, Visual and Tactile cues isolate correct mm. Updated HEP with PRE's with instructions to do them in front of mirror to check her form.  Patient Education:  Discussed sleeping habits and encouraged to limit her self to 1 pillow at night (vs pt reports sleeping w/ 2 pillows) to support c-spine.        Exercise Flow Sheet  Exercise Specifics 11/24/16 11/29/16 12/07/15 12/08/16 12/22/16 12/27/16 12/29/16 01/03/17        UBE  >  In use x2'/x2'  JS x2'/x2'  JS 2'/2'  pc 2'/2'  pc 2'/2'  GB 2'/2'  pc 2'/2'  GB      Prone scap ret     10x5"  GB 5 Sec x5  JS    With shoulder extension 5 sec x5  JS On half foam roll  H ABD RTB x20  GB    B ER RTB x20  GB Standing with back and head at wall  RTB Finning  2x10  pc RTB   GTB  Finning  x10  GB    RTB H ABD  x20  GB RTB  Finning with back at wall  2x10  pc RTB  Finning with back at wall  2x10  GB      Cervical elongation    Supine  10x5"  GB Standing against the wall 5 sec x10  JS Standing against the wall 5 sec x10  GB Standing with back and head at wall  RTB   H Abd  2x10  pc > R shoulder flexion doorframe stretch 2x30"  GB >       On Half Foam  Tin Man  [employer]  angels    1'  Ea  GB x1 min each  JS 1'  Ea  GB Standing at wall   1' ea  pc PRE's with 1# flex  scap  abd  10x ea  pc PRE's with 1# flex  scap  abd  10x ea  GB RTB 3-way vectors   BUE  5x  pc RTB 3-way vectors   BUE  10x  GB           Standing against wall chin tuck and scap retraction with shoulder flexion x10  JS Standing against wall chin tuck and scap retraction with shoulder flexion x10  GB Side lying open book  10x   Bilat  pc Standing open book  5x alt  bilat  pc > Standing open  book  5x alt  bilat  pc Standing open book  10x alt  bilat  GB            Swan RTB x20  GB RTB  2x10  pc RTB  PD  2x10  pc > >                                                                                            Home Exercise Program                 (all exercises supervised by clinician unless noted otherwise)      NMR:     Frequent tactile and verbal cues for scap PD, lumbar spine in neutral position and TA activation    Therapeutic Activities:  None    Manual Therapy:     Prone:   PA along T-spine Grade II-III   MFR B UT and T-spine    Supine:   STM/DTM/MFR/TPR: Right Biceps Brachii, Suprapinatus, Levator scap, UT, Rhomboid, Anterior and Middle Deltoid.   Grade 3 Inferior and Posterior humeral head glides   Gentle scap depression stretch   Ulnar and median nerve glides        Initial Evaluation Reference and/orCurrent Measurements (ROM, Strength, Girth, Outcomes, etc.):      AROM: Cervical Spine   Initial  12/06/16  12/27/16    Flexion 38  50  40    Extension 54  60  40     R L R L      Rotation 74 75 72 65 60 60   Side Bending 40 45 40 40 40 40   (blank fields were intentionally left blank)    InitialRight  AROM 02/26  Right  AROM Shoulder  WFL and Limitations with pain during movement InitialLeft AROM 02/26  Left AROM   145 125 Flexion 154 140     Extension     133 155 Abduction 175 170     Internal Rotation       External Rotation     (blank fields were intentionally  left blank)      STRENGTH  Cervical  MMT /5 IE R IE L R L   C1/2 Neck Flex 5      Neck Ext 5      C3 Neck Sidebend 5 5     Neck Rotation       (blank fields were intentionally left blank)    Initial R 03/05  R UE Strength  MMT /5 Initial  L 03/05  L   5  Shoulder Flexion 5    5  Shoulder Abduction (C5) 5      Shoulder Extension     4-  Shoulder External Rotation 4-    5  Shoulder Internal Rotation 5     4+ Rhomboids  4     Serratus       Upper Trapezius (C4)      4 Middle Trapezius (C4)  4-    4+ Lower Trapezius (C4)  4   5  Biceps (C6) 5    5  Triceps (  C7) 5    5  Wrist Extension (C7) 5    5  Wrist Flexion (C7) 5    (blank fields were intentionally left blank)                    Modalities: None  Therapy Rationale: Other: decline       Assessment (response to treatment):   Pt w/ decrease c/o pain on arrival.  Tolerated a long trip fairly well.  Today some tenderness w/ manual along R biceps.  Still some periscapular weakness noted.  Tolerated session well and minimal to no pain noted at the end.    Progress towards functional goals: See below      Goals:  Date (Body Area, Impairment Goal, Functional   Activity, Target Performance) Time Frame Status Date/  Initial   11/23/16   Improve cervical sidebend to 45 degrees to promote decrease in R UE radicular symptoms so patient may sleep 6 hours.  12 sessions Not met 12/27/16  GB   11/23/16   Increase active cervical rotation to 75 degrees bilaterally for increased visual field to allow safe changing lanes and backing up vehicle while driving with pain < 9/60  12 sessions Not met 12/27/16  GB   11/23/16   Increase mid-trap/lower trap strength 5/5 for maintenance of proper sitting posture needed for computer use > 2 hours.  12 sessions Ongoing 01/03/17  GB   11/23/16   Patient able to demonstrate correct body mechanics and proper back position during sitting, sleeping and  lifting activities to allow resume household chores .  12 sessions Improved postural awareness 12/08/16  PC   11/23/16   Decrease Neck Disability Index (NDI) to 28% from 38% to exceed Minimal Detectable Change (MDC) of 7 points.  12 sessions 46% today with improvement 12/22/16  PC       Patient requires continued skilled care to: address neck pain, R shoulder pain and limited ROM and strength as above    Plan:  Continue with Plan of Care. Continue to focus on improved strength and correct mm activation with postural training.  Reassess AROM shoulder next session    Johnell Bas M. Tennyson, DPT, [place] [id number]  01/03/2017         Reference:    [phone]        Access Code: [email or web address]   URL: [email or web address]   Date: 11/29/2016   Prepared by: Faythe Dingwall     Exercises   Seated Scapular Retraction - 10 reps - 1 sets - 5 hold - 5x daily - 7x weekly   Seated Cervical Retraction - 10 reps - 1 sets - 5 hold - 5x daily - 7x weekly   Shoulder Flexion at Wall - 10 reps - 1 sets - 1x daily - 7x weekly     Access Code: [facility]   URL: [email or web address]   Date: 11/24/2016   Prepared by: Lilli Light     Exercises   Seated Upper Trapezius Stretch - 3 reps - 1 sets - 20 hold - 3x daily - 7x weekly   Seated Levator Scapulae Stretch - 3 reps - 1 sets - 20 hold - 3x daily - 7x weekly   Sternocleidomastoid Stretch - 3 reps - 1 sets - 29 hold - 3x daily - 7x weekly     Updated HEP 12/22/16:   BUE PRE's with 1# weight: 2x10 ea  Flexion to 90 deg  Scap to 90 deg  Abduction to 90 deg

## 2017-01-05 ENCOUNTER — Inpatient Hospital Stay: Payer: Non-veteran care

## 2017-01-05 DIAGNOSIS — M5412 Radiculopathy, cervical region: Secondary | ICD-10-CM | POA: Insufficient documentation

## 2017-01-05 NOTE — PT/OT Therapy Note (Addendum)
DAILY NOTE   01/05/2017        Total Treatment (billable)Time: 40  Total Timed Minutes:  40 ([place])   Visit Number:  11/12 (14 per [place])     Certification Status Ends: 01/23/17    Payor: [employer] / Plan: [employer] / Product Type: COMMERCIAL /    # of Authorized Visits: 14 Visit #: 11      Diagnosis (Treating/Medical):     ICD-10-CM    1. Cervical radiculopathy M54.12          Subjective:  Stephanie Hurley report no HA or dizziness today.  Pt says she has some pain across her shoulder blades.  Functional Status: Pt has been sick over the past few days and is feeling a little better but has a bad cough, which aggravates her upper back and neck.    Objective:   Treatment:  Therapeutic Exercise: to improve: Flexibility/ROM, Stabilization and Strength   Warm-up:  UBE with subjective discussion.   Modifications: There ex per flow sheet Verbal, Visual and Tactile cues isolate correct mm. ROM measurements taken today  Patient Education:  Continued discussion on sleeping habits and encouraged to limit her self to 1 pillow at night (vs pt reports sleeping w/ 2 pillows) to support c-spine.  She got a foam pillow and is not comfortable with it; recommended she place a soft fluffy pillow over it to provide soft support as opposed to firm support.        Exercise Flow Sheet  Exercise Specifics 11/24/16 11/29/16 12/07/15 12/08/16 12/22/16 12/27/16 12/29/16 01/03/17 01/05/17       UBE  >  In use x2'/x2'  JS x2'/x2'  JS 2'/2'  pc 2'/2'  pc 2'/2'  GB 2'/2'  pc 2'/2'  GB 2'/2'  pc     Prone scap ret     10x5"  GB 5 Sec x5  JS    With shoulder extension 5 sec x5  JS On half foam roll  H ABD RTB x20  GB    B ER RTB x20  GB Standing with back and head at wall  RTB Finning  2x10  pc RTB   GTB  Finning  x10  GB    RTB H ABD  x20  GB RTB  Finning with back at wall  2x10  pc RTB  Finning with back at wall  2x10  GB RTB  Finning with back at wall  2x10  pc     Cervical elongation    Supine  10x5"  GB Standing against the wall 5 sec  x10  JS Standing against the wall 5 sec x10  GB Standing with back and head at wall  RTB   H Abd  2x10  pc > R shoulder flexion doorframe stretch 2x30"  GB >  Reverse push ups  10x5"  pc     On Half Foam  Tin Man  [employer]  angels    1'  Ea  GB x1 min each  JS 1'  Ea  GB Standing at wall   1' ea  pc PRE's with 1# flex  scap  abd  10x ea  pc PRE's with 1# flex  scap  abd  10x ea  GB RTB 3-way vectors   BUE  5x  pc RTB 3-way vectors   BUE  10x  GB RTB 3-way vectors   BUE  10x  pc          Standing  against wall chin tuck and scap retraction with shoulder flexion x10  JS Standing against wall chin tuck and scap retraction with shoulder flexion x10  GB Side lying open book  10x   Bilat  pc Standing open book  5x alt  bilat  pc > Standing open book  5x alt  bilat  pc Standing open book  10x alt  bilat  GB Prone on ball  T's and W's  10x ea  pc           Swan RTB x20  GB RTB  2x10  pc RTB  PD  2x10  pc > >  >                                                                                          Home Exercise Program                 (all exercises supervised by clinician unless noted otherwise)      NMR:     Frequent tactile and verbal cues for scap PD, lumbar spine in neutral position and TA activation   Scap PD with isometric and isotonic resistance in side lying, bilat    Therapeutic Activities:  None    Manual Therapy:   Supine:   STM/DTM/MFR/TPR: Right Biceps Brachii, Suprapinatus, Levator scap, UT, Rhomboid, Anterior and Middle Deltoid.   Grade 3 Inferior and Posterior humeral head glides   Gentle scap depression stretch   Ulnar and median nerve glides        Initial Evaluation Reference and/orCurrent Measurements (ROM, Strength, Girth, Outcomes, etc.):      AROM: Cervical Spine   Initial  12/06/16  12/27/16     Flexion 38  50  40     Extension 54  60  40      R L R L       Rotation 74 75 72 65 60 60    Side Bending 40 45 40 40 40 40    (blank fields were intentionally left  blank)    InitialRight  AROM  02/26  Right  AROM Shoulder  WFL and Limitations with pain during movement InitialLeft AROM 02/26  Left AROM 01/05/17  Right AROM   145  125 Flexion 154 140 150      Extension      133  155 Abduction 175 170 160      Internal Rotation   70      External Rotation   90   (blank fields were intentionally left blank)      STRENGTH  Cervical  MMT /5 IE R IE L R L   C1/2 Neck Flex 5      Neck Ext 5      C3 Neck Sidebend 5 5     Neck Rotation       (blank fields were intentionally left blank)    Initial R 03/05  R UE Strength  MMT /5 Initial  L 03/05  L   5  Shoulder Flexion 5    5  Shoulder Abduction (C5) 5  Shoulder Extension     4-  Shoulder External Rotation 4-    5  Shoulder Internal Rotation 5     4+ Rhomboids  4     Serratus       Upper Trapezius (C4)      4 Middle Trapezius (C4)  4-    4+ Lower Trapezius (C4)  4   5  Biceps (C6) 5    5  Triceps (C7) 5    5  Wrist Extension (C7) 5    5  Wrist Flexion (C7) 5    (blank fields were intentionally left blank)                    Modalities: Hot Pack 10 min. Location cervical and scapular areas Position Seated in long sit position  Therapy Rationale: Other: decline       Assessment (response to treatment):   Pt continues to have less cervical pain.  ROM measurements indicate improved R shoulder ROM.  Pt was challenged with reverse pushups due to ongoing weakness of the periscapular mm and decreased core strength.     Progress towards functional goals: See below      Goals:  Date (Body Area, Impairment Goal, Functional   Activity, Target Performance) Time Frame Status Date/  Initial   11/23/16   Improve cervical sidebend to 45 degrees to promote decrease in R UE radicular symptoms so patient may sleep 6 hours.  12 sessions Not met 12/27/16  GB   11/23/16   Increase active cervical rotation to 75 degrees bilaterally for increased  visual field to allow safe changing lanes and backing up vehicle while driving with pain < 4/33  12 sessions Not met 12/27/16  GB   11/23/16   Increase mid-trap/lower trap strength 5/5 for maintenance of proper sitting posture needed for computer use > 2 hours.  12 sessions Ongoing 01/03/17  GB   11/23/16   Patient able to demonstrate correct body mechanics and proper back position during sitting, sleeping and lifting activities to allow resume household chores .  12 sessions Improved postural awareness 12/08/16  PC   11/23/16   Decrease Neck Disability Index (NDI) to 28% from 38% to exceed Minimal Detectable Change (MDC) of 7 points.  12 sessions 46% today with improvement 12/22/16  PC       Patient requires continued skilled care to: address neck pain, R shoulder pain and limited ROM and strength as above    Plan:  Continue with Plan of Care. Continue to focus on improved strength and correct mm activation with postural training.      Evlyn Kanner [place], [place] # [address]  01/05/2017         Reference:    [phone]        Access Code: [email or web address]   URL: [email or web address]   Date: 11/29/2016   Prepared by: Faythe Dingwall     Exercises   Seated Scapular Retraction - 10 reps - 1 sets - 5 hold - 5x daily - 7x weekly   Seated Cervical Retraction - 10 reps - 1 sets - 5 hold - 5x daily - 7x weekly   Shoulder Flexion at Wall - 10 reps - 1 sets - 1x daily - 7x weekly     Access Code: [facility]   URL: [email or web address]   Date: 11/24/2016   Prepared by: Lilli Light     Exercises   Seated Upper Trapezius Stretch - 3 reps - 1 sets - 20  hold - 3x daily - 7x weekly   Seated Levator Scapulae Stretch - 3 reps - 1 sets - 20 hold - 3x daily - 7x weekly   Sternocleidomastoid Stretch - 3 reps - 1 sets - 29 hold - 3x daily - 7x weekly     Updated HEP 12/22/16:   BUE PRE's with 1# weight: 2x10 ea  Flexion to 90 deg  Scap to 90 deg  Abduction to 90 deg

## 2017-01-10 ENCOUNTER — Inpatient Hospital Stay: Payer: Non-veteran care

## 2017-01-12 ENCOUNTER — Inpatient Hospital Stay: Payer: Non-veteran care

## 2017-01-13 ENCOUNTER — Inpatient Hospital Stay: Payer: Non-veteran care

## 2017-01-13 DIAGNOSIS — M5412 Radiculopathy, cervical region: Secondary | ICD-10-CM

## 2017-01-13 NOTE — PT/OT Therapy Note (Addendum)
DAILY NOTE   01/13/2017        Total Treatment (billable)Time: 40  Total Timed Minutes:  40 ([place])   Visit Number:  12/14 (14 per [place])     Certification Status Ends: 01/23/17    Payor: [employer] / Plan: [employer] / Product Type: COMMERCIAL /    # of Authorized Visits: 14 Visit #: 11      Diagnosis (Treating/Medical):     ICD-10-CM    1. Cervical radiculopathy M54.12          Subjective:  Stephanie Hurley says her pain is at 4-5/10 at the center of her neck but denies HA or dizziness today.    Functional Status: Pt says she has been working on improving her posture, especially when working on her computer.    Objective:   Treatment:  Therapeutic Exercise: to improve: Flexibility/ROM, Stabilization and Strength   Warm-up:  UBE with subjective discussion.   Modifications: There ex per flow sheet Verbal, Visual and Tactile cues isolate correct mm. ROM measurements taken today  Patient Education:  Continued discussion on sleeping habits and encouraged to limit her self to 1 pillow at night (vs pt reports sleeping w/ 2 pillows) to support c-spine.  She got a foam pillow and is not comfortable with it; recommended she place a soft fluffy pillow over it to provide soft support as opposed to firm support.        Exercise Flow Sheet  Exercise Specifics 11/24/16 11/29/16 12/07/15 12/08/16 12/22/16 12/27/16 12/29/16 01/03/17 01/05/17 01/13/17      UBE  >  In use x2'/x2'  JS x2'/x2'  JS 2'/2'  pc 2'/2'  pc 2'/2'  GB 2'/2'  pc 2'/2'  GB 2'/2'  pc 2'/2'  pc    Prone scap ret     10x5"  GB 5 Sec x5  JS    With shoulder extension 5 sec x5  JS On half foam roll  H ABD RTB x20  GB    B ER RTB x20  GB Standing with back and head at wall  RTB Finning  2x10  pc RTB   GTB  Finning  x10  GB    RTB H ABD  x20  GB RTB  Finning with back at wall  2x10  pc RTB  Finning with back at wall  2x10  GB RTB  Finning with back at wall  2x10  pc 2x10  pc    Cervical elongation    Supine  10x5"  GB Standing against the wall 5 sec x10  JS Standing  against the wall 5 sec x10  GB Standing with back and head at wall  RTB   H Abd  2x10  pc > R shoulder flexion doorframe stretch 2x30"  GB >  Reverse push ups  10x5"  pc 10x5"  pc    On Half Foam  Tin Man  [employer]  angels    1'  Ea  GB x1 min each  JS 1'  Ea  GB Standing at wall   1' ea  pc PRE's with 1# flex  scap  abd  10x ea  pc PRE's with 1# flex  scap  abd  10x ea  GB RTB 3-way vectors   BUE  5x  pc RTB 3-way vectors   BUE  10x  GB RTB 3-way vectors   BUE  10x  pc RTB 3-way vectors   BUE  10x  pc  Standing against wall chin tuck and scap retraction with shoulder flexion x10  JS Standing against wall chin tuck and scap retraction with shoulder flexion x10  GB Side lying open book  10x   Bilat  pc Standing open book  5x alt  bilat  pc > Standing open book  5x alt  bilat  pc Standing open book  10x alt  bilat  GB Prone on ball  T's and W's  10x ea  pc >          Swan RTB x20  GB RTB  2x10  pc RTB  PD  2x10  pc > >  > Supine chin tuck with head lift  10x5"  pc                                                                                         Home Exercise Program                 (all exercises supervised by clinician unless noted otherwise)      NMR:     Frequent tactile and verbal cues for scap PD, lumbar spine in neutral position and TA activation   Scap PD with isometric and isotonic resistance in side lying, bilat    Therapeutic Activities:  None    Manual Therapy:   Supine:   STM/DTM/MFR/TPR: Right Biceps Brachii, Suprapinatus, Levator scap, UT, Rhomboid, Anterior and Middle Deltoid.   Grade 3 Inferior and Posterior humeral head glides   Gentle scap depression stretch   Manual cervical traction   Grade 3 PA mobs to cervical spine        Initial Evaluation Reference and/orCurrent Measurements (ROM, Strength, Girth, Outcomes, etc.):      AROM: Cervical Spine   Initial  12/06/16  12/27/16  01/13/17    Flexion 38  50  40  45    Extension 54  60  40  45     R L R L    R L    Rotation 74 75 72 65 60 60 60 60   Side Bending 40 45 40 40 40 40 45 p 45   (blank fields were intentionally left blank)    InitialRight  AROM  02/26  Right  AROM Shoulder  WFL and Limitations with pain during movement InitialLeft AROM 02/26  Left AROM 01/05/17  Right AROM   145  125 Flexion 154 140 150      Extension      133  155 Abduction 175 170 160      Internal Rotation   70      External Rotation   90   (blank fields were intentionally left blank)      STRENGTH  Cervical  MMT /5 IE R IE L R L   C1/2 Neck Flex 5      Neck Ext 5      C3 Neck Sidebend 5 5     Neck Rotation       (blank fields were intentionally left blank)    Initial R 03/05  R UE Strength  MMT /5 Initial  L  03/05  L   5  Shoulder Flexion 5    5  Shoulder Abduction (C5) 5      Shoulder Extension     4-  Shoulder External Rotation 4-    5  Shoulder Internal Rotation 5     4+ Rhomboids  4     Serratus       Upper Trapezius (C4)      4 Middle Trapezius (C4)  4-    4+ Lower Trapezius (C4)  4   5  Biceps (C6) 5    5  Triceps (C7) 5    5  Wrist Extension (C7) 5    5  Wrist Flexion (C7) 5    (blank fields were intentionally left blank)                    Modalities: Hot Pack 10 min. Location cervical and scapular areas Position Seated in long sit position  Therapy Rationale: Other: decline       Assessment (response to treatment):   Pt reports decreased level of cervical and R shoulder pain.  ROM measurements indicate improved cervical ROM.  She continues to have ST restrictions of the UT and LS which improves after MT.  Pt still requires occasional cues for correct form with therex.    Progress towards functional goals: See below    Goals:  Date (Body Area, Impairment Goal, Functional   Activity, Target Performance) Time Frame Status Date/  Initial   11/23/16   Improve cervical sidebend to 45 degrees to promote decrease in R UE  radicular symptoms so patient may sleep 6 hours.  12 sessions Met 01/13/17  PC   11/23/16   Increase active cervical rotation to 75 degrees bilaterally for increased visual field to allow safe changing lanes and backing up vehicle while driving with pain < 1/61  12 sessions Not met 01/13/17  [place] [facility]   11/23/16   Increase mid-trap/lower trap strength 5/5 for maintenance of proper sitting posture needed for computer use > 2 hours.  12 sessions Ongoing 01/03/17  GB   11/23/16   Patient able to demonstrate correct body mechanics and proper back position during sitting, sleeping and lifting activities to allow resume household chores .  12 sessions Improved postural awareness 01/13/17  PC   11/23/16   Decrease Neck Disability Index (NDI) to 28% from 38% to exceed Minimal Detectable Change (MDC) of 7 points.  12 sessions 46% today with improvement 12/22/16  PC       Patient requires continued skilled care to: address neck pain, R shoulder pain and limited ROM and strength as above    Plan:  Continue with Plan of Care. Outcomes next session.  Evlyn Kanner [place], [place]# [id number]  01/13/2017         Reference:    [id number]        Access Code: [email or web address]   URL: [email or web address]   Date: 11/29/2016   Prepared by: Faythe Dingwall     Exercises   Seated Scapular Retraction - 10 reps - 1 sets - 5 hold - 5x daily - 7x weekly   Seated Cervical Retraction - 10 reps - 1 sets - 5 hold - 5x daily - 7x weekly   Shoulder Flexion at Wall - 10 reps - 1 sets - 1x daily - 7x weekly     Access Code: [facility]   URL: [email or web address]   Date: 11/24/2016   Prepared by: Lilli Light  Exercises   Seated Upper Trapezius Stretch - 3 reps - 1 sets - 20 hold - 3x daily - 7x weekly   Seated Levator Scapulae Stretch - 3 reps - 1 sets - 20 hold - 3x daily - 7x weekly   Sternocleidomastoid Stretch - 3 reps - 1 sets - 29 hold - 3x daily - 7x weekly     Updated HEP 12/22/16:   BUE PRE's with 1# weight: 2x10 ea  Flexion to 90  deg  Scap to 90 deg  Abduction to 90 deg

## 2017-01-18 ENCOUNTER — Inpatient Hospital Stay: Payer: Non-veteran care

## 2017-01-18 DIAGNOSIS — M5412 Radiculopathy, cervical region: Secondary | ICD-10-CM | POA: Insufficient documentation

## 2017-01-18 NOTE — PT/OT Therapy Note (Signed)
DAILY NOTE   01/18/2017        Total Treatment (billable)Time: 30' (pt arrived 58' late)     Total Timed Minutes:  30 ([place])   Visit Number:  13/14 (14 per [place])     Certification Status Ends: 01/23/17    Payor: [employer] / Plan: [employer] / Product Type: COMMERCIAL /    # of Authorized Visits: 14 Visit #: 12      Diagnosis (Treating/Medical):     ICD-10-CM    1. Cervical radiculopathy M54.12          Subjective:  Stephanie Hurley says her pain is at 4-5/10 in her neck and she has a HA today, but it is mild.  Functional Status: Pt states she has been using a foam pillow with a thin soft pillow on top.  She said this works better than just the foam pillow alone.  She said her problem is that she sleeps on her stomach and has pain when she rotates her head to the right, but it is okay if she rotates to the left.  RUE is feeling okay today.    Objective:   Treatment:  Therapeutic Exercise: to improve: Flexibility/ROM, Stabilization and Strength   Warm-up:  UBE with subjective discussion.   Modifications: There ex per flow sheet Verbal, Visual and Tactile cues isolate correct mm. ROM measurements taken today  Patient Education:  Discussed to prepare pt for upcoming D/C.  Continued discussion on sleeping habits and encouraged to limit her self to 1 pillow at night (vs pt reports sleeping w/ 2 pillows) to support c-spine; recommend pt sleep on her side or back and try to avoid sleeping on her stomach due to stress on the spine.       Exercise Flow Sheet  Exercise Specifics 11/24/16 11/29/16 12/07/15 12/08/16 12/22/16 12/27/16 12/29/16 01/03/17 01/05/17 01/13/17 01/18/17     UBE  >  In use x2'/x2'  JS x2'/x2'  JS 2'/2'  pc 2'/2'  pc 2'/2'  GB 2'/2'  pc 2'/2'  GB 2'/2'  pc 2'/2'  pc Held due to late arrival   Prone scap ret     10x5"  GB 5 Sec x5  JS    With shoulder extension 5 sec x5  JS On half foam roll  H ABD RTB x20  GB    B ER RTB x20  GB Standing with back and head at wall  RTB Finning  2x10  pc RTB    GTB  Finning  x10  GB    RTB H ABD  x20  GB RTB  Finning with back at wall  2x10  pc RTB  Finning with back at wall  2x10  GB RTB  Finning with back at wall  2x10  pc 2x10  pc >   Cervical elongation    Supine  10x5"  GB Standing against the wall 5 sec x10  JS Standing against the wall 5 sec x10  GB Standing with back and head at wall  RTB   H Abd  2x10  pc > R shoulder flexion doorframe stretch 2x30"  GB >  Reverse push ups  10x5"  pc 10x5"  pc 10x5"  pc   On Half Foam  Tin Man  [name]  angels    1'  Ea  GB x1 min each  JS 1'  Ea  GB Standing at wall   1' ea  pc PRE's with 1# flex  scap  abd  10x ea  pc PRE's with 1# flex  scap  abd  10x ea  GB RTB 3-way vectors   BUE  5x  pc RTB 3-way vectors   BUE  10x  GB RTB 3-way vectors   BUE  10x  pc RTB 3-way vectors   BUE  10x  pc >        Standing against wall chin tuck and scap retraction with shoulder flexion x10  JS Standing against wall chin tuck and scap retraction with shoulder flexion x10  GB Side lying open book  10x   Bilat  pc Standing open book  5x alt  bilat  pc > Standing open book  5x alt  bilat  pc Standing open book  10x alt  bilat  GB Prone on ball  T's and W's  10x ea  pc > Side lying open book  10x   Bilat  pc         Swan RTB x20  GB RTB  2x10  pc RTB  PD  2x10  pc > >  > Supine chin tuck with head lift  10x5"  pc Supine chin tuck with head lift  10x5"  pc                                                                                        Home Exercise Program                 (all exercises supervised by clinician unless noted otherwise)      NMR:     Frequent tactile and verbal cues for scap PD, lumbar spine in neutral position and TA activation      Therapeutic Activities:  None    Manual Therapy:   Supine:   STM/DTM/MFR/TPR: Cervical PVM, Levator scap, UT, Rhomboid, Anterior and Middle Deltoid.   Grade 3 Inferior and Posterior humeral head glides   Gentle scap depression stretch   Manual cervical traction/SO Release   Grade 3 PA  mobs to cervical spine with PA, lateral glides, and rotational mobs   Passive cervical ROM stretches        Initial Evaluation Reference and/orCurrent Measurements (ROM, Strength, Girth, Outcomes, etc.):      AROM: Cervical Spine   Initial  12/06/16  12/27/16  01/13/17    Flexion 38  50  40  45    Extension 54  60  40  45     R L R L    R L   Rotation 74 75 72 65 60 60 60 60   Side Bending 40 45 40 40 40 40 45 p 45   (blank fields were intentionally left blank)    InitialRight  AROM  02/26  Right  AROM Shoulder  WFL and Limitations with pain during movement InitialLeft AROM 02/26  Left AROM 01/05/17  Right AROM   145  125 Flexion 154 140 150      Extension      133  155 Abduction 175 170 160      Internal Rotation   70  External Rotation   90   (blank fields were intentionally left blank)      STRENGTH  Cervical  MMT /5 IE R IE L R L   C1/2 Neck Flex 5      Neck Ext 5      C3 Neck Sidebend 5 5     Neck Rotation       (blank fields were intentionally left blank)    Initial R 03/05  R UE Strength  MMT /5 Initial  L 03/05  L   5  Shoulder Flexion 5    5  Shoulder Abduction (C5) 5      Shoulder Extension     4-  Shoulder External Rotation 4-    5  Shoulder Internal Rotation 5     4+ Rhomboids  4     Serratus       Upper Trapezius (C4)      4 Middle Trapezius (C4)  4-    4+ Lower Trapezius (C4)  4   5  Biceps (C6) 5    5  Triceps (C7) 5    5  Wrist Extension (C7) 5    5  Wrist Flexion (C7) 5    (blank fields were intentionally left blank)                    Modalities: Hot Pack 10 min. Location cervical and scapular areas Position Seated in long sit position  Therapy Rationale: Other: decline       Assessment (response to treatment):   Pt reports decreased level of cervical and R shoulder pain.  ROM measurements indicate improved cervical ROM.  She continues to have ST restrictions of the UT  and LS which improves after MT.  Pt still requires occasional cues for correct form with therex.    Progress towards functional goals: See below    Goals:  Date (Body Area, Impairment Goal, Functional   Activity, Target Performance) Time Frame Status Date/  Initial   11/23/16   Improve cervical sidebend to 45 degrees to promote decrease in R UE radicular symptoms so patient may sleep 6 hours.  12 sessions Met 01/13/17  PC   11/23/16   Increase active cervical rotation to 75 degrees bilaterally for increased visual field to allow safe changing lanes and backing up vehicle while driving with pain < 5/62  12 sessions Not met 01/13/17  [facility]   11/23/16   Increase mid-trap/lower trap strength 5/5 for maintenance of proper sitting posture needed for computer use > 2 hours.  12 sessions Ongoing 01/03/17  GB   11/23/16   Patient able to demonstrate correct body mechanics and proper back position during sitting, sleeping and lifting activities to allow resume household chores .  12 sessions Improved postural awareness 01/13/17  PC   11/23/16   Decrease Neck Disability Index (NDI) to 28% from 38% to exceed Minimal Detectable Change (MDC) of 7 points.  12 sessions 46% today with improvement 12/22/16  PC       Patient requires continued skilled care to: address neck pain, R shoulder pain and limited ROM and strength as above    Plan:  D/C next session with goal assessment and outcomes.  Review of D/C HEP and body mechanics/posture.   Evlyn Kanner [place], [place]  01/18/2017         Reference:    [phone]        Access Code: [email or web address]   URL: [email or web address]  Date: 11/29/2016   Prepared by: Faythe Dingwall     Exercises   Seated Scapular Retraction - 10 reps - 1 sets - 5 hold - 5x daily - 7x weekly   Seated Cervical Retraction - 10 reps - 1 sets - 5 hold - 5x daily - 7x weekly   Shoulder Flexion at Wall - 10 reps - 1 sets - 1x daily - 7x weekly     Access Code: [facility]   URL:  [email or web address]   Date: 11/24/2016   Prepared by: Lilli Light     Exercises   Seated Upper Trapezius Stretch - 3 reps - 1 sets - 20 hold - 3x daily - 7x weekly   Seated Levator Scapulae Stretch - 3 reps - 1 sets - 20 hold - 3x daily - 7x weekly   Sternocleidomastoid Stretch - 3 reps - 1 sets - 29 hold - 3x daily - 7x weekly     Updated HEP 12/22/16:   BUE PRE's with 1# weight: 2x10 ea  Flexion to 90 deg  Scap to 90 deg  Abduction to 90 deg

## 2017-01-21 ENCOUNTER — Inpatient Hospital Stay: Payer: Non-veteran care

## 2017-01-21 DIAGNOSIS — M5412 Radiculopathy, cervical region: Secondary | ICD-10-CM | POA: Insufficient documentation

## 2017-01-21 NOTE — PT/OT Therapy Note (Signed)
DAILY NOTE   01/21/2017        Total Treatment (billable)Time: 30' (pt arrived 56' late)     Total Timed Minutes:  30 ([place])   Visit Number:  13/14 (14 per [place])     Certification Status Ends: 01/23/17    Payor: [employer] / Plan: [employer] / Product Type: COMMERCIAL /    # of Authorized Visits: 14 Visit #: 14      Diagnosis (Treating/Medical):     ICD-10-CM    1. Cervical radiculopathy M54.12          Subjective:  Stephanie Hurley says her pain is at 4-5/10 in her neck and she has a HA today, but it is mild.  Functional Status: Pt states she has been using a foam pillow with a thin soft pillow on top.  She said this works better than just the foam pillow alone.  She said her problem is that she sleeps on her stomach and has pain when she rotates her head to the right, but it is okay if she rotates to the left.  RUE is feeling okay today.    Objective:   Treatment:  Therapeutic Exercise: to improve: Flexibility/ROM, Stabilization and Strength   Warm-up:  UBE with subjective discussion.   Modifications: There ex per flow sheet Verbal, Visual and Tactile cues isolate correct mm. ROM measurements taken today  Patient Education:  Discussed to prepare pt for upcoming D/C.  Continued discussion on sleeping habits and encouraged to limit her self to 1 pillow at night (vs pt reports sleeping w/ 2 pillows) to support c-spine; recommend pt sleep on her side or back and try to avoid sleeping on her stomach due to stress on the spine.       Exercise Flow Sheet  Exercise Specifics 11/24/16 11/29/16 12/07/15 12/08/16 12/22/16 12/27/16 12/29/16 01/03/17 01/05/17 01/13/17 01/18/17     UBE  >  In use x2'/x2'  JS x2'/x2'  JS 2'/2'  pc 2'/2'  pc 2'/2'  GB 2'/2'  pc 2'/2'  GB 2'/2'  pc 2'/2'  pc Held due to late arrival   Prone scap ret     10x5"  GB 5 Sec x5  JS    With shoulder extension 5 sec x5  JS On half foam roll  H ABD RTB x20  GB    B ER RTB x20  GB Standing with back and head at wall  RTB Finning  2x10  pc RTB    GTB  Finning  x10  GB    RTB H ABD  x20  GB RTB  Finning with back at wall  2x10  pc RTB  Finning with back at wall  2x10  GB RTB  Finning with back at wall  2x10  pc 2x10  pc >   Cervical elongation    Supine  10x5"  GB Standing against the wall 5 sec x10  JS Standing against the wall 5 sec x10  GB Standing with back and head at wall  RTB   H Abd  2x10  pc > R shoulder flexion doorframe stretch 2x30"  GB >  Reverse push ups  10x5"  pc 10x5"  pc 10x5"  pc   On Half Foam  Tin Man  [name]  angels    1'  Ea  GB x1 min each  JS 1'  Ea  GB Standing at wall   1' ea  pc PRE's with 1# flex  scap  abd  10x ea  pc PRE's with 1# flex  scap  abd  10x ea  GB RTB 3-way vectors   BUE  5x  pc RTB 3-way vectors   BUE  10x  GB RTB 3-way vectors   BUE  10x  pc RTB 3-way vectors   BUE  10x  pc >        Standing against wall chin tuck and scap retraction with shoulder flexion x10  JS Standing against wall chin tuck and scap retraction with shoulder flexion x10  GB Side lying open book  10x   Bilat  pc Standing open book  5x alt  bilat  pc > Standing open book  5x alt  bilat  pc Standing open book  10x alt  bilat  GB Prone on ball  T's and W's  10x ea  pc > Side lying open book  10x   Bilat  pc         Swan RTB x20  GB RTB  2x10  pc RTB  PD  2x10  pc > >  > Supine chin tuck with head lift  10x5"  pc Supine chin tuck with head lift  10x5"  pc                                                                                        Home Exercise Program                 (all exercises supervised by clinician unless noted otherwise)      NMR:     Frequent tactile and verbal cues for scap PD, lumbar spine in neutral position and TA activation      Therapeutic Activities:  None    Manual Therapy:   Supine:   STM/DTM/MFR/TPR: Cervical PVM, Levator scap, UT, Rhomboid, Anterior and Middle Deltoid.   Grade 3 Inferior and Posterior humeral head glides   Gentle scap depression stretch   Manual cervical traction/SO Release   Grade 3 PA  mobs to cervical spine with PA, lateral glides, and rotational mobs   Passive cervical ROM stretches        Initial Evaluation Reference and/orCurrent Measurements (ROM, Strength, Girth, Outcomes, etc.):      AROM: Cervical Spine   Initial  12/06/16  12/27/16  01/13/17    Flexion 38  50  40  45    Extension 54  60  40  45     R L R L    R L   Rotation 74 75 72 65 60 60 60 60   Side Bending 40 45 40 40 40 40 45 p 45   (blank fields were intentionally left blank)    InitialRight  AROM  02/26  Right  AROM Shoulder  WFL and Limitations with pain during movement InitialLeft AROM 02/26  Left AROM 01/05/17  Right AROM   145  125 Flexion 154 140 150      Extension      133  155 Abduction 175 170 160      Internal Rotation   70  External Rotation   90   (blank fields were intentionally left blank)      STRENGTH  Cervical  MMT /5 IE R IE L R L   C1/2 Neck Flex 5      Neck Ext 5      C3 Neck Sidebend 5 5     Neck Rotation       (blank fields were intentionally left blank)    Initial R 03/05  R UE Strength  MMT /5 Initial  L 03/05  L   5  Shoulder Flexion 5    5  Shoulder Abduction (C5) 5      Shoulder Extension     4-  Shoulder External Rotation 4-    5  Shoulder Internal Rotation 5     4+ Rhomboids  4     Serratus       Upper Trapezius (C4)      4 Middle Trapezius (C4)  4-    4+ Lower Trapezius (C4)  4   5  Biceps (C6) 5    5  Triceps (C7) 5    5  Wrist Extension (C7) 5    5  Wrist Flexion (C7) 5    (blank fields were intentionally left blank)                    Modalities: Hot Pack 10 min. Location cervical and scapular areas Position Seated in long sit position  Therapy Rationale: Other: decline       Assessment (response to treatment):   Pt reports decreased level of cervical and R shoulder pain.  ROM measurements indicate improved cervical ROM.  She continues to have ST restrictions of the UT  and LS which improves after MT.  Pt still requires occasional cues for correct form with therex.    Progress towards functional goals: See below    Goals:  Date (Body Area, Impairment Goal, Functional   Activity, Target Performance) Time Frame Status Date/  Initial   11/23/16   Improve cervical sidebend to 45 degrees to promote decrease in R UE radicular symptoms so patient may sleep 6 hours.  12 sessions Met 01/13/17  PC   11/23/16   Increase active cervical rotation to 75 degrees bilaterally for increased visual field to allow safe changing lanes and backing up vehicle while driving with pain < 1/61  12 sessions Not met 01/13/17  [facility]   11/23/16   Increase mid-trap/lower trap strength 5/5 for maintenance of proper sitting posture needed for computer use > 2 hours.  12 sessions Ongoing 01/03/17  GB   11/23/16   Patient able to demonstrate correct body mechanics and proper back position during sitting, sleeping and lifting activities to allow resume household chores .  12 sessions Improved postural awareness 01/13/17  PC   11/23/16   Decrease Neck Disability Index (NDI) to 28% from 38% to exceed Minimal Detectable Change (MDC) of 7 points.  12 sessions 46% today with improvement 12/22/16  PC       Patient requires continued skilled care to: address neck pain, R shoulder pain and limited ROM and strength as above    Plan:  D/C next session with goal assessment and outcomes.  Review of D/C HEP and body mechanics/posture.   Evlyn Kanner [place], [place] # [address]60  01/21/2017         Reference:    [phone]        Access Code: [email or web address]   URL: [email or web address]  Date: 11/29/2016   Prepared by: Faythe Dingwall     Exercises   Seated Scapular Retraction - 10 reps - 1 sets - 5 hold - 5x daily - 7x weekly   Seated Cervical Retraction - 10 reps - 1 sets - 5 hold - 5x daily - 7x weekly   Shoulder Flexion at Wall - 10 reps - 1 sets - 1x daily - 7x weekly     Access Code: [facility]   URL:  [email or web address]   Date: 11/24/2016   Prepared by: Lilli Light     Exercises   Seated Upper Trapezius Stretch - 3 reps - 1 sets - 20 hold - 3x daily - 7x weekly   Seated Levator Scapulae Stretch - 3 reps - 1 sets - 20 hold - 3x daily - 7x weekly   Sternocleidomastoid Stretch - 3 reps - 1 sets - 29 hold - 3x daily - 7x weekly     Updated HEP 12/22/16:   BUE PRE's with 1# weight: 2x10 ea  Flexion to 90 deg  Scap to 90 deg  Abduction to 90 deg        Access Code: [email or web address]   URL: [email or web address]   Date: 01/21/2017   Prepared by: Allison Quarry     Exercises   Seated Upper Trapezius Stretch - 3 reps - 1 sets - 5 hold - 1x daily - 7x weekly   Seated Levator Scapulae Stretch - 3 reps - 1 sets - 5 hold - 1x daily - 7x weekly   Seated Cervical Rotation AROM - 10 reps - 2 sets - 5 hold - 1x daily - 7x weekly   Beginner Sidelying Spinal Rotation - 10 reps - 2 sets - 5 hold - 1x daily - 7x weekly   Standing Bilateral Low Shoulder Row with Anchored Resistance - 10 reps - 2 sets - 5 hold - 1x daily - 7x weekly   Shoulder Extension with Resistance - Neutral - 10 reps - 2 sets - 5 hold - 1x daily - 7x weekly   Axial Elongation - 10 reps - 2 sets - 5 hold - 1x daily - 7x weekly   Standing Pivot Prone - 10 reps - 2 sets - 5 hold - 1x daily - 7x weekly

## 2017-01-25 ENCOUNTER — Inpatient Hospital Stay: Payer: Non-veteran care | Admitting: Rehabilitative and Restorative Service Providers"

## 2017-05-02 NOTE — Progress Notes (Deleted)
Stephanie Hurley is a 36 y.o. female here for     No chief complaint on file.        HPI  ***    ROS:  Constitutional: Denies any fever, chills, weight loss  Eyes: Denies blurry vision, double vision  ENT: Denies  coryza, sore throat  Cardiovascular: Denies chest pain, shortness of breath, lightheadedness, palpitations  Respiratory: Denies cough, shortness of breath  Gastrointestinal: Denies abd pain, diarrhea, constipation, nausea, vomiting, blood in the stool  GU: Denies dysuria, nocturia, hematuria  Muskuloskeletal: Denies chronic myalgias or joint pain  Neuro: denies chronic headache, lightheadedness  Skin:denies rash or discoloration  All other systems reviewed and is negative    Past Medical History:   Diagnosis Date   . Depression    . Migraine    . Pain in joint        No past surgical history on file.    Family History   Problem Relation Age of Onset   . Diabetes Mother    . Hypertension Mother    . Diabetes Father    . Hypertension Father         Social History   Substance Use Topics   . Smoking status: Never Smoker   . Smokeless tobacco: Never Used   . Alcohol use Yes      Comment: occasionally             Current Outpatient Prescriptions:   .  albuterol (PROVENTIL HFA;VENTOLIN HFA) 108 (90 Base) MCG/ACT inhaler, Inhale 2 puffs into the lungs every 6 (six) hours as needed., Disp: 1 Inhaler, Rfl: 0  .  FLUoxetine HCl (PROZAC PO), Take by mouth., Disp: , Rfl:   .  sertraline (ZOLOFT) 50 MG tablet, Take 50 mg by mouth daily., Disp: , Rfl:       No Known Allergies     Physical Exam:  There were no vitals filed for this visit.   There is no height or weight on file to calculate BMI.      Constitutional: well nourished, well developed female in no acute distress  Eyes: pupils round and reactive to light bilaterally, extraocular movements intact bilaterally, conjunctiva clear bilaterally  ENT: sinuses nontender, ear canals clean and tympanic membranes intact bilaterally, hearing grossly intact, nasal  membranes nonerythematous bilaterally, oropharynx pink and moist, throat nonerythematous and clear of exudate  Neck: supple with no limited range of motion, thyroid normal sized  Card:  S1S2, RRR, no murmurs noted, 2+pedal pulses, no observed pitting edema in bilateral lower extremities***no carotid bruits bilaterally***,  Pulm: clear to auscultation bilaterally, no wheezes or rhonchi noted, good respiratory effort with no use of accessory muscles  GI: nontender, nondistended, no masses noted.  Lymph: no enlarged submandibular or cervical nodes noted  Neuro: Awake and alert, oriented x 3  Psych: normal affect with normal insight, nonanxious      Labs reviewed  ***    Assessment and Plan:    ***      Current and alternate treatment plan discussed with patient. Patient understands and agrees with plan.  Common side effects of medications were discussed with patient and all questions were addressed. Discussed symptoms the patient should watch for that would prompt the need for reevaluation.

## 2017-05-03 ENCOUNTER — Ambulatory Visit (INDEPENDENT_AMBULATORY_CARE_PROVIDER_SITE_OTHER): Payer: BC Managed Care – PPO | Admitting: Internal Medicine

## 2017-06-27 ENCOUNTER — Encounter (INDEPENDENT_AMBULATORY_CARE_PROVIDER_SITE_OTHER): Payer: Self-pay

## 2017-06-27 ENCOUNTER — Ambulatory Visit (INDEPENDENT_AMBULATORY_CARE_PROVIDER_SITE_OTHER): Payer: BC Managed Care – PPO | Admitting: Student in an Organized Health Care Education/Training Program

## 2017-06-28 ENCOUNTER — Encounter (INDEPENDENT_AMBULATORY_CARE_PROVIDER_SITE_OTHER): Payer: Self-pay | Admitting: Neurology

## 2017-06-28 ENCOUNTER — Ambulatory Visit (INDEPENDENT_AMBULATORY_CARE_PROVIDER_SITE_OTHER): Admitting: Neurology

## 2017-06-28 VITALS — BP 117/73 | HR 65

## 2017-06-28 DIAGNOSIS — G4719 Other hypersomnia: Secondary | ICD-10-CM

## 2017-06-28 DIAGNOSIS — R51 Headache: Secondary | ICD-10-CM

## 2017-06-28 DIAGNOSIS — R55 Syncope and collapse: Secondary | ICD-10-CM

## 2017-06-28 DIAGNOSIS — G4710 Hypersomnia, unspecified: Secondary | ICD-10-CM

## 2017-06-28 DIAGNOSIS — R519 Headache, unspecified: Secondary | ICD-10-CM

## 2017-06-28 MED ORDER — LEVETIRACETAM 500 MG PO TABS
500.0000 mg | ORAL_TABLET | Freq: Two times a day (BID) | ORAL | 5 refills | Status: DC
Start: 2017-06-28 — End: 2017-08-11

## 2017-06-28 NOTE — Progress Notes (Signed)
Subjective:       Patient ID: Stephanie Hurley is a 36 y.o. female.    Consult requested by   Bradly Chris, MD       and note sent to her.    CC:  Dizziness and near syncope    HPI:  She started having dizziness in July 19th. She had episodes of near syncope. Called EMT and had increaased HR and BP. Went to ED and said to have vertigo.   She stdill has dizziness, like pressure from the neck, then dizziness, occurring almost daily, lasting abougt 10 minutes. Meclizine helps. ocurs anytime, with activityi or at rest. Near syncope persists, occurring every few days. Also has imbalance.  Migraines present since 2013, 5-6 times a month, pain usually right side and occiput, throbbing. Lasts up to 3 hours. Imitrex works well.   She has sleep problems, sleep onset and sleep maintenance. Trazodone works fairly well  There is daytime hypersomnia. The patient complains of low energy levels. There is reduced concentration and attention. The patient doses off during the day if alone or inactive. There is an issue with reduced short term memory. The situation is worse if there is not enough sleep.      Migraines, 56-The following portions of the patient's history were reviewed and updated as appropriate: allergies, current medications, past family history, past medical history, past social history, past surgical history and problem list.    Review of Systems  The patient denies loss of consciousness or syncope. There is no chest pain or shortness of breath. All systems were reviewed and were negative except as described in the HPI.    Active Ambulatory Problems     Diagnosis Date Noted   . Cervical radiculopathy 11/24/2016     Resolved Ambulatory Problems     Diagnosis Date Noted   . Acute pain of both knees 03/02/2016     Past Medical History:   Diagnosis Date   . Depression    . Migraine    . Pain in joint      Patient Active Problem List   Diagnosis   . Cervical radiculopathy           Objective:    Physical  Exam  BP 117/73   Pulse 65   The patient is well developed and well nourished. Cognitive functions are normal and the patient is oriented times three. Speech is normal without aphasia or dysarthria. Recent and remote memory are intact. Attention span and concentration are normal. Fund of knowledge is normal.    Cranial nerves reveal normal fundi (CN II). Extraocular movements are intact. Pupils are equally round and reactive to light (CN III, IV and VI). Facial sensation is intact (CN V). The face is symmetric (CN VII). Hearing is intact to rub (CN VIII). Palate movement is normal (CN IX). Shoulder shrug is intact (CN XI). The tongue is midline (CN XII). fundiscopic exam is normal bilaterally.    Motor strength is intact in all extremities with normal tone and bulk. There are no fasiculations or atrophy. There are normal movements of all extremities. Myotatic reflexes are intact for biceps, triceps, quadriceps and achilles. Babinski's are absent bilaterally.  Sensation is intact to fine tactile and vibration in all extremities. Coordination is intact for rapid alternating movements, finger-to-nose, heel-to-shin, and fine coordination.  Station and gait are normal without ataxia.    Auscultation of the heart is normal without murmurs or abnormal sounds. Lungs are clear to auscultation.  Skin color is normal. There are no carotid bruits. The peripheral vascular system is intact to palpation.        Assessment:       1)  Hear syncope and dizziness.  2)  Migraine headaches  3)  Sleep disorder with daytime hypersomnia.      Plan:      Procedures  No orders of the defined types were placed in this encounter.      1)  MRI brain   2)  EEG  3)  Dopplers for syncompe  4)  Polysomnogram  5)  MSLT  6)  Keppra for headache    Follow up in 3 months      Alija Riano B. Lance Coon, MD  [facility] Neurology  Medical Director of Electrophysiology and Sleep  [facility]  Associate Professor of Neurology  [facility], [place]  [phone] [place] office  [phone] [place] office

## 2017-07-06 ENCOUNTER — Ambulatory Visit (INDEPENDENT_AMBULATORY_CARE_PROVIDER_SITE_OTHER)

## 2017-07-06 ENCOUNTER — Ambulatory Visit (INDEPENDENT_AMBULATORY_CARE_PROVIDER_SITE_OTHER): Admitting: Neurology

## 2017-07-06 DIAGNOSIS — R55 Syncope and collapse: Secondary | ICD-10-CM

## 2017-07-06 NOTE — Progress Notes (Signed)
Tech Report   EEG No: [id number]  Hrs of sleep: 7hrs  Dominance: right   Last Meal: AM  Caffeine:  No  Previous EEG: No  Stephanie Hurley has a current medication list which includes the following prescription(s): albuterol, fluoxetine hcl, levetiracetam, meclizine, prazosin, sertraline, and sumatriptan.   History: Dizziness   Description: HV, PS, Awake, Drowsy and Alert  Activity Noted:    8-9 Hz Background activity, LVF anterior, Driving with PS, Slowing with HV, GS with Drowsiness    Technologist: Letta Pate Nakiyah Beverley

## 2017-07-11 NOTE — Progress Notes (Signed)
Bilateral Carotid and TCD studies done  See chart for info.

## 2017-07-20 ENCOUNTER — Ambulatory Visit

## 2017-07-21 ENCOUNTER — Ambulatory Visit

## 2017-07-27 ENCOUNTER — Ambulatory Visit: Payer: BC Managed Care – PPO | Attending: Neurology

## 2017-07-27 DIAGNOSIS — G4710 Hypersomnia, unspecified: Secondary | ICD-10-CM

## 2017-07-27 DIAGNOSIS — G4719 Other hypersomnia: Secondary | ICD-10-CM | POA: Insufficient documentation

## 2017-07-28 ENCOUNTER — Ambulatory Visit: Payer: BC Managed Care – PPO | Attending: Neurology

## 2017-07-28 DIAGNOSIS — G4719 Other hypersomnia: Secondary | ICD-10-CM | POA: Insufficient documentation

## 2017-07-28 NOTE — Procedures (Signed)
Please see tech notes. RLM

## 2017-07-28 NOTE — Procedures (Signed)
Patient is here for an MSLT, diagnostic NPSG performed the night prior with few respiratory events observed. Patient slept during all 4 nap opportunities, mean sleep latency 7.25 min. No REM observed.

## 2017-08-06 NOTE — Procedures (Signed)
Multiple Sleep Latency Test    Name:  Stephanie Hurley    Date:  07/28/2017    Indication:  Daytime hypersomnia    Findings:  The patient slept on each of 4 test periods. Sleep latency ranged from 5.3 to 12.2 minutes. The mean sleep latency was 8.5 minutes. There were no REM periods.    Impression:  This is an abnormal Multiple Sleep Latency Test with moderate daytime hypersomnia.    Floris Neuhaus B. Lance Coon, MD  [facility] Neurology  Medical Director of Neuroscience and Stroke  [facility]  [phone]  [phone]

## 2017-08-07 NOTE — Procedures (Signed)
There is a 9 Hz posteriorly dominant alpha rhythm that is fairly well modulated, and achieves amplitudes up to 45 mv. Frontally dominant beta is seen with frequencies between 20 and 30Hz  and low amplitudes. There is occasional left temporal sharp wave activity. Delta slowing is seen in the left temporal area. Photic stimulation produces bilateral photic driving responses. Hyperventilation causes slowing of the background rhythms. Drowsiness causes slowing of the background and more prominent theta activity. The patient falls into stage II sleep with symmetrical sleep spindles and K-complexes.    Impression:  This is an abnormal routine EEG in the awake, drowsy and alseep states with intermittent slowing and epileptiform activity I the left temporal area suggesting a structural abnormality and potential epileptic focus. Clinical correlation is recommended.

## 2017-08-11 ENCOUNTER — Ambulatory Visit (INDEPENDENT_AMBULATORY_CARE_PROVIDER_SITE_OTHER): Admitting: Neurology

## 2017-08-11 ENCOUNTER — Encounter (INDEPENDENT_AMBULATORY_CARE_PROVIDER_SITE_OTHER): Payer: Self-pay | Admitting: Neurology

## 2017-08-11 VITALS — BP 114/80 | HR 71

## 2017-08-11 DIAGNOSIS — G4710 Hypersomnia, unspecified: Secondary | ICD-10-CM

## 2017-08-11 DIAGNOSIS — G4719 Other hypersomnia: Secondary | ICD-10-CM

## 2017-08-11 DIAGNOSIS — R519 Headache, unspecified: Secondary | ICD-10-CM | POA: Insufficient documentation

## 2017-08-11 DIAGNOSIS — R42 Dizziness and giddiness: Secondary | ICD-10-CM

## 2017-08-11 DIAGNOSIS — R51 Headache: Secondary | ICD-10-CM

## 2017-08-11 MED ORDER — SCOPOLAMINE 1 MG/3DAYS TD PT72
1.0000 | MEDICATED_PATCH | TRANSDERMAL | 5 refills | Status: AC
Start: 2017-08-11 — End: ?

## 2017-08-11 MED ORDER — LAMOTRIGINE 25 MG PO TABS
ORAL_TABLET | ORAL | 5 refills | Status: AC
Start: 2017-08-11 — End: ?

## 2017-08-11 NOTE — Progress Notes (Signed)
Subjective:       Patient ID: Stephanie Hurley is a 36 y.o. female.    HPI  She has had occasional dizziness, somewhat improved.  She still has daily headaches. She started Keppra but it made her feel tired and was stopped. She usually wakes up with headache.  She has had no recurrent syncopal events. EEG showed occasional left temporal sharps, likely not enough to be significant. The carotid dopplers were normal.  The daytime hypersomnia persists. The polysomnogram was normal. The MSLT was moderately abnormal with a mean sleep latency of 8.5 minutes.    The following portions of the patient's history were reviewed and updated as appropriate: allergies, current medications, past family history, past medical history, past social history, past surgical history and problem list.    Review of Systems  The patient denies loss of consciousness or syncope. There is no chest pain or shortness of breath. All systems were reviewed and were negative except as described in the HPI.    Active Ambulatory Problems     Diagnosis Date Noted   . Cervical radiculopathy 11/24/2016     Resolved Ambulatory Problems     Diagnosis Date Noted   . Acute pain of both knees 03/02/2016     Past Medical History:   Diagnosis Date   . Depression    . Migraine    . Pain in joint      Patient Active Problem List   Diagnosis   . Cervical radiculopathy           Objective:    Physical Exam  BP 114/80   Pulse 71   The patient is well developed and well nourished, sitting comfortably in the exam room in no acute distress. Patient is awake and alert. Speech is normal without aphasia. Cognitive function is intact, oriented times three. Recent and remote memory are intact.  Cranial nerves reveal that pupils are equally reactive to light. Extraocular muscles are intact with conjugate movements. There is no ptosis. The face is symmetric. There is no sensory loss in the face. The tongue is midline.  Motor exam is intact with equal strength bilaterally.  Muscle tone and bulk are normal. There are no fasciculations and no atrophy. Myotatic reflexes are equal.   Sensation is intact in all extremities to pinprick and fine tactile stimulation.   Coordination is intact for rapid alternating movements, finger to nose and heel to shin. There is no dysmetria .Gait is intact with no ataxia.         Assessment:       1)  Recurring dizziness with some improvement  2)  Chronic daily headache  3)  No recurrent syncope, tests normal  4)  Persistent daytime hypersomnia      Plan:      Procedures  No orders of the defined types were placed in this encounter.      1)  Stop Keppra  2)  Start Lamictal for headache  3)  Trial of Scopolamine transdermal for dizziness.  4)  Later, consider Provigil for persistent EDS.    Follow up in 2 months.      Roel Douthat B. Lance Coon, MD  [facility] Neurology  Medical Director of Electrophysiology and Sleep  [facility]  Associate Professor of Neurology  [facility], [place]  [phone] [place] office  [phone] [place] office

## 2017-08-12 ENCOUNTER — Encounter (INDEPENDENT_AMBULATORY_CARE_PROVIDER_SITE_OTHER): Payer: Self-pay

## 2017-08-12 NOTE — Progress Notes (Signed)
Received MRI brain and CT Head reports from [facility], scanned and forwarded to MD for review

## 2017-08-12 NOTE — Progress Notes (Signed)
Faxed request for MRI Brain results from the [facility] [phone] (F)

## 2017-08-29 ENCOUNTER — Telehealth (INDEPENDENT_AMBULATORY_CARE_PROVIDER_SITE_OTHER): Payer: Self-pay

## 2017-08-29 NOTE — Telephone Encounter (Signed)
Patient advises that she was experiencing dry mouth & dizziness since starting Lamotragine. The scopolamine patch did not really help with the dizziness.  Last dose 08/28/17, last headache 10/26.  Is there an alternative medication she can try?

## 2017-09-01 NOTE — Telephone Encounter (Signed)
Spoke with patient, both will need to be replaced. She stopped taking the Lamotragine 10/28 but continued to have dry mouth & dizziness but visited the ED due to same with added confusion and R arm numbness - resolved, as Scopolamine patch discontinued.

## 2017-09-01 NOTE — Telephone Encounter (Signed)
Does she want something to replace Lamictal or Scopolamine or both?

## 2017-09-02 NOTE — Telephone Encounter (Signed)
Called patient and advised to continue taking the Lamictal and to try taking the Meclizine for dizziness. Will follow-up with her on Monday.

## 2017-09-02 NOTE — Telephone Encounter (Signed)
I would stay with Lamictal which sounds like it is not related to dry mouth and dizziness, and Scopolamine is more likely to cause dry mouth.   The only two meds that really help for dizziness are Scopolamine and Meclizine. She may want to go back to Meclizine. She may already have that at home.

## 2017-09-18 ENCOUNTER — Ambulatory Visit

## 2017-09-19 ENCOUNTER — Ambulatory Visit

## 2017-09-27 ENCOUNTER — Telehealth (INDEPENDENT_AMBULATORY_CARE_PROVIDER_SITE_OTHER): Payer: Self-pay

## 2017-09-27 NOTE — Telephone Encounter (Signed)
She reports no further issues with the medication.  Reviewed chart, patient diagnosed with daytime hypersomnia and had MLST & PSG in September, chart to MD to review results and advise.

## 2017-09-28 NOTE — Telephone Encounter (Signed)
Called patient and advised of same - left voicemail

## 2017-09-28 NOTE — Telephone Encounter (Signed)
Last visit (october) I started Lamictal for headache and scopolamine patch for dizziness.   We discussed the sleep test results then, and discussed starting Provigil next office visit, which should be next month.

## 2017-10-04 ENCOUNTER — Telehealth (INDEPENDENT_AMBULATORY_CARE_PROVIDER_SITE_OTHER): Payer: Self-pay

## 2017-10-04 NOTE — Telephone Encounter (Signed)
Patient would like you to review her CT scan and advise. It was done at the [facility] 06/04/17.

## 2017-10-04 NOTE — Telephone Encounter (Signed)
The study was normal

## 2017-11-04 ENCOUNTER — Encounter (INDEPENDENT_AMBULATORY_CARE_PROVIDER_SITE_OTHER): Payer: Self-pay | Admitting: Neurology

## 2017-11-04 ENCOUNTER — Ambulatory Visit (INDEPENDENT_AMBULATORY_CARE_PROVIDER_SITE_OTHER): Admitting: Neurology

## 2017-11-04 VITALS — BP 124/88 | HR 66 | Ht 64.0 in | Wt 188.0 lb

## 2017-11-04 DIAGNOSIS — M542 Cervicalgia: Secondary | ICD-10-CM

## 2017-11-04 MED ORDER — METAXALONE 800 MG PO TABS
800.0000 mg | ORAL_TABLET | Freq: Three times a day (TID) | ORAL | 5 refills | Status: AC
Start: 2017-11-04 — End: ?

## 2017-11-05 NOTE — Progress Notes (Signed)
Neurology Progress Note    Patient Name:  Stephanie Hurley, Stephanie Hurley    HPI:  She has a feeling of pressure in the frontal and occipital areas which can be severe. She also has neck pain associated with head pain. This occurs most days. She started Lamictal and increased to 2/2 but no help.  She had side effects on Keppra. The HCT and MRI on August 10th were normal.    She has history of dizziness, not bad now.  No recurrent syncope.  She has EDS with mild idiopathic hypersomnia.    The following portions of the patient's history were reviewed and updated as appropriate: allergies, current medications, past family history, past medical history, past social history, past surgical history and problem list.    The patient denies loss of consciousness or syncope. There is no chest pain or shortness of breath. All systems were reviewed and were negative except as described in the HPI.    Active Ambulatory Problems     Diagnosis Date Noted   . Cervical radiculopathy 11/24/2016   . Dizziness 08/11/2017   . Headache 08/11/2017   . Daytime hypersomnia 08/11/2017     Resolved Ambulatory Problems     Diagnosis Date Noted   . Acute pain of both knees 03/02/2016     Past Medical History:   Diagnosis Date   . Depression    . Migraine    . Pain in joint      Patient Active Problem List   Diagnosis   . Cervical radiculopathy   . Dizziness   . Headache   . Daytime hypersomnia           Exam     BP 124/88 (BP Site: Right arm, Patient Position: Sitting, Cuff Size: Large)   Pulse 66   Ht 1.626 m (5\' 4" )   Wt 85.3 kg (188 lb)   BMI 32.27 kg/m   The patient is well developed and well nourished, sitting comfortably in the exam room in no acute distress. Patient is awake and alert. Speech is normal without aphasia. Cognitive function is intact, oriented times three. Recent and remote memory are intact.  Cranial nerves reveal that pupils are equally reactive to light. Extraocular muscles are intact with conjugate movements. There is no  ptosis. The face is symmetric. There is no sensory loss in the face. The tongue is midline.  Motor exam is intact with equal strength bilaterally. Muscle tone and bulk are normal. There are no fasciculations and no atrophy. Myotatic reflexes are equal.   Sensation is intact in all extremities to pinprick and fine tactile stimulation.   Coordination is intact for rapid alternating movements, finger to nose and heel to shin. There is no dysmetria .Gait is intact with no ataxia.       Assessment:    1)  Tension headaches  2)  EDS with mild IH  3)  Dizziness, improved  4)  Syncope with no recurrence      Plan:    1)  Try skelaxin for neck pain/headache    Follow up in 6 weeks.    Venancio Chenier B. Lance Coon, MD  [facility] Neurology  Medical Director of Electrophysiology and Sleep  [facility]  Associate Professor of Neurology  [facility], [place]  [phone] [place] office  [phone] [place] office

## 2017-12-14 ENCOUNTER — Encounter (INDEPENDENT_AMBULATORY_CARE_PROVIDER_SITE_OTHER): Payer: Self-pay | Admitting: Neurology

## 2017-12-14 ENCOUNTER — Ambulatory Visit (INDEPENDENT_AMBULATORY_CARE_PROVIDER_SITE_OTHER): Payer: BC Managed Care – PPO | Admitting: Neurology

## 2017-12-14 VITALS — BP 109/75 | HR 116 | Ht 64.0 in | Wt 189.0 lb

## 2018-05-11 ENCOUNTER — Ambulatory Visit (INDEPENDENT_AMBULATORY_CARE_PROVIDER_SITE_OTHER): Admitting: Neurology

## 2018-08-03 ENCOUNTER — Encounter (INDEPENDENT_AMBULATORY_CARE_PROVIDER_SITE_OTHER): Payer: Self-pay

## 2021-08-10 NOTE — Addendum Note (Signed)
Addended by: Darrol Jump B on: 08/10/2021 08:32 AM     Modules accepted: Level of Service

## 2023-05-10 IMAGING — MR MRI HAND RT WO CONTRAST
10 series · 40 of 40 positions shown · non-contrast
Comparison: None.

﻿ MRI OF THE RIGHT HAND
INDICATION: Right hand pain.
TECHNIQUE: Multisequential and multiplanar images of the right hand were submitted for review without contrast. Limited field of view with wrist in focus

[Series 1: scano s/t/c · axial · 10.0mm · 0.94mm/px · 1 of 9 slices shown (1 of 2)]
[im 1/9]
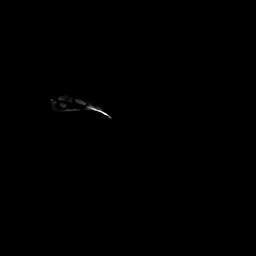

[Series 2: scano s/t/c · axial · right · 10.0mm · 0.94mm/px · z∈[-10,+120]mm · 2 of 9 slices shown (2 of 2)]
[im 1/9]
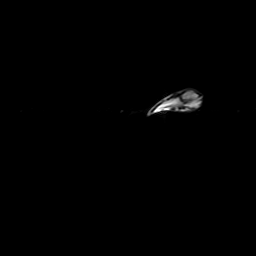
[im 9/9]
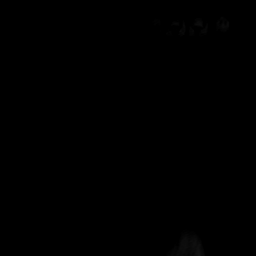

[Series 3: T1 · axial · right · 4.0mm · 0.62mm/px · z∈[-74,+29]mm · 6 of 24 slices shown (1 of 3)]
[im 1/24]
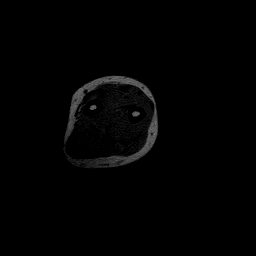
[im 5/24]
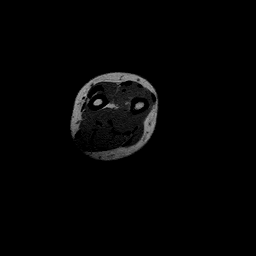
[im 10/24]
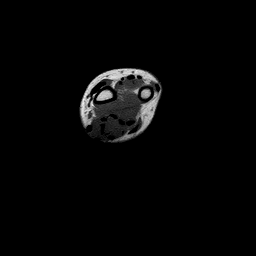
[im 14/24]
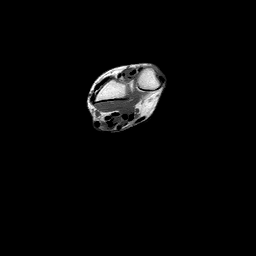
[im 19/24]
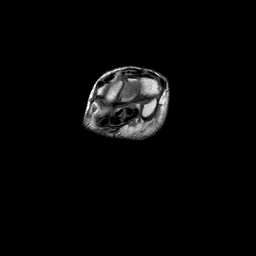
[im 24/24]
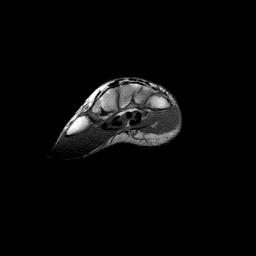

[Series 4: ir trs · axial · right · 4.0mm · 0.62mm/px · z∈[-74,+29]mm · 6 of 24 slices shown]
[im 1/24]
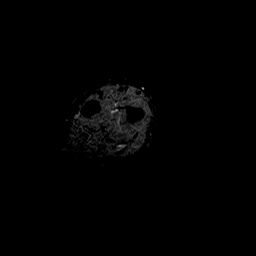
[im 5/24]
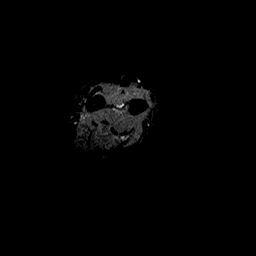
[im 10/24]
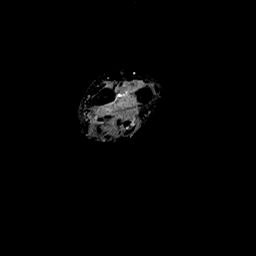
[im 14/24]
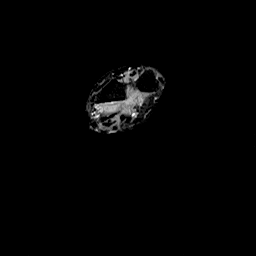
[im 19/24]
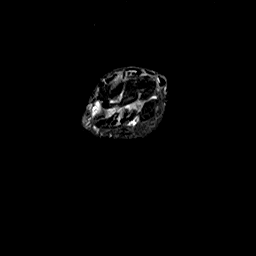
[im 24/24]
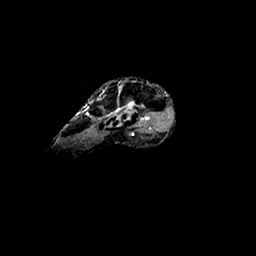

[Series 5: T2 · axial · right · 4.0mm · 0.62mm/px · z∈[-74,+29]mm · 6 of 24 slices shown (1 of 2)]
[im 1/24]
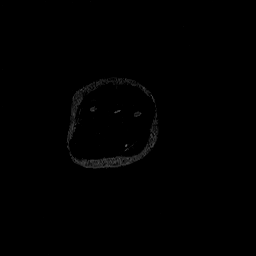
[im 5/24]
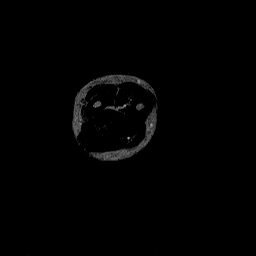
[im 10/24]
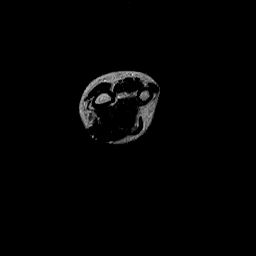
[im 14/24]
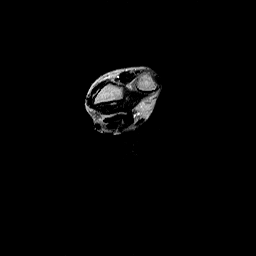
[im 19/24]
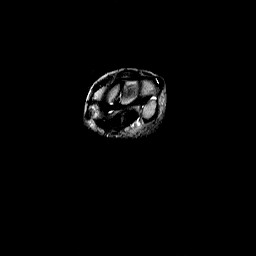
[im 24/24]
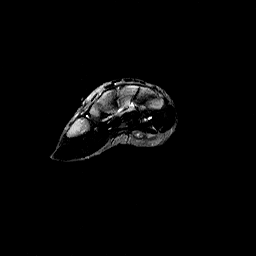

[Series 6: T1 · coronal · right · 3.0mm · 0.78mm/px · 4 of 16 slices shown (2 of 3)]
[im 1/16]
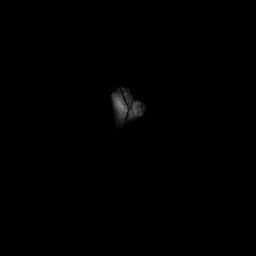
[im 6/16]
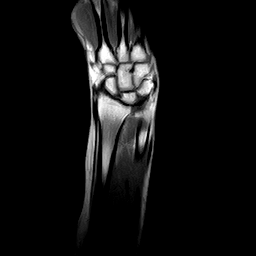
[im 11/16]
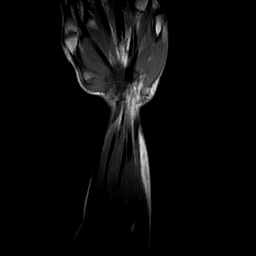
[im 16/16]
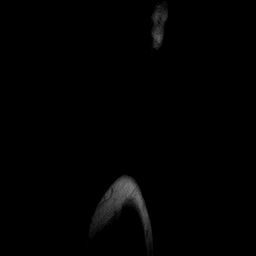

[Series 7: T2 · coronal · right · 3.0mm · 0.78mm/px · 4 of 16 slices shown (2 of 2)]
[im 1/16]
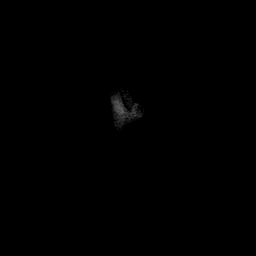
[im 6/16]
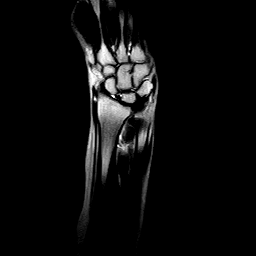
[im 11/16]
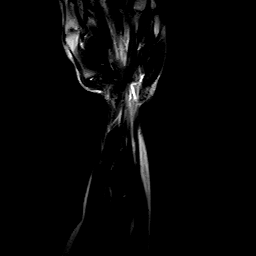
[im 16/16]
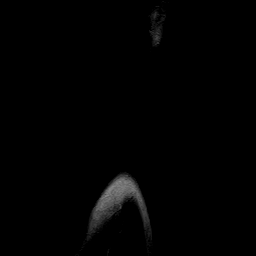

[Series 8: ir cor · coronal · right · 3.0mm · 0.78mm/px · 3 of 12 slices shown]
[im 1/12]
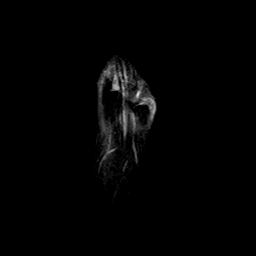
[im 6/12]
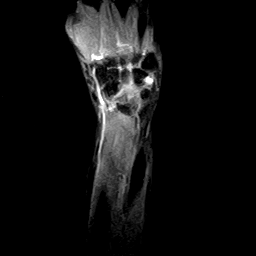
[im 12/12]
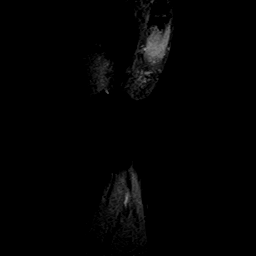

[Series 9: ir sag · sagittal · right · 3.0mm · 0.78mm/px · 4 of 16 slices shown]
[im 1/16]
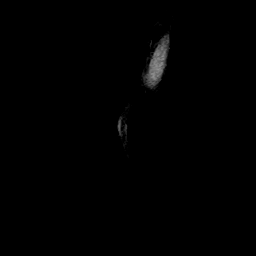
[im 6/16]
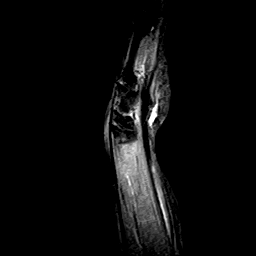
[im 11/16]
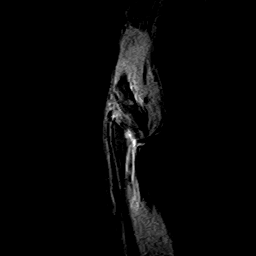
[im 16/16]
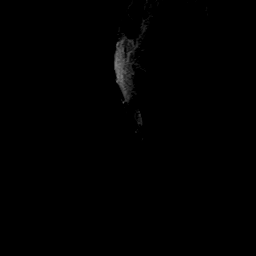

[Series 10: T1 · sagittal · right · 3.0mm · 0.78mm/px · 4 of 16 slices shown (3 of 3)]
[im 1/16]
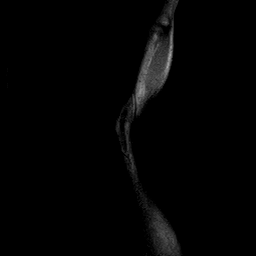
[im 6/16]
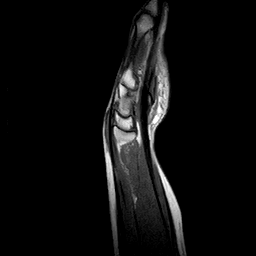
[im 11/16]
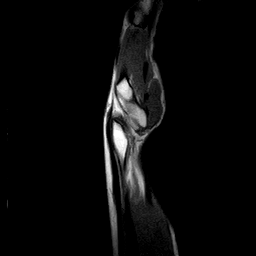
[im 16/16]
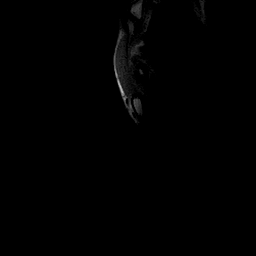

[40 of 40 positions shown; findings below may reference images not displayed]

FINDINGS: Neutral ulnar variance is seen.

The triangular fibrocartilage complex is within normal limits, without degeneration or tear.  Radial ulnar styloid attachments are intact.

Intact intrinsic carpal ligaments including the scapholunate and lunotriquetral ligaments.  Normal visualized volar and dorsal extrinsic ligaments.

Normal intercarpal relationships without DISI or VISI instability patterns.

The extensor tendons are intact without tendon tear, subluxation or tenosynovitis.

Intact flexor tendons without tear or tenosynovitis.

The carpal tunnel is within normal limits with an unremarkable visualized median nerve. Well-preserved Guyon’s canal and normal visualized ulnar nerve.

The carpal bones are well aligned.

Bone marrow signal of the visualized osseous structures is within normal limits without bone contusion, fracture or AVN.

Ganglion cyst as noted in between the pisiform and trapezoid bone (series 9, image 4). There are no soft tissue masses. Normal subcutaneous adipose space.
IMPRESSION: Ganglion cyst in between the pisiform and trapezoid bone (series 9, image 4).

## 2023-05-16 IMAGING — MR MRI HAND LT WO CONTRAST
5 series · 40 of 40 positions shown · non-contrast
Comparison: None.

﻿MRI LEFT HAND WITHOUT CONTRAST
INDICATION: Left hand pain
TECHNIQUE: MRI of the left hand was performed on a low field strength open MRI scanner. Multiplanar multisequence imaging was performed including coronal T1 and T2; axial T1 and T2; sagittal T1 and IR sequences without contrast.

[Series 2: scano s/t/c · axial · left · 10.0mm · 0.94mm/px · z∈[+0,+120]mm · 2 of 4 slices shown]
[im 1/4]
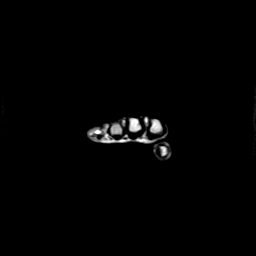
[im 4/4]
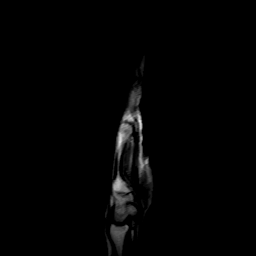

[Series 5: T1 · coronal · left · 3.0mm · 0.78mm/px · 8 of 14 slices shown (1 of 2)]
[im 1/14]
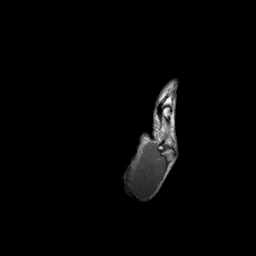
[im 2/14]
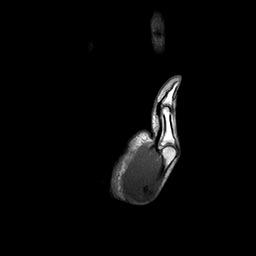
[im 4/14]
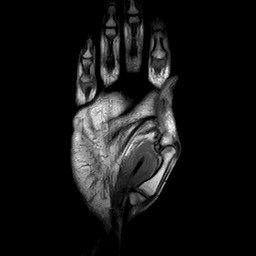
[im 6/14]
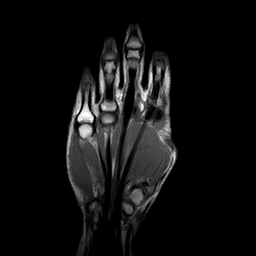
[im 8/14]
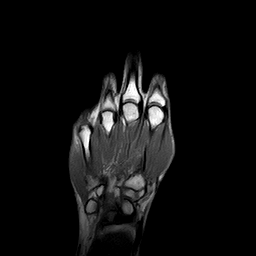
[im 10/14]
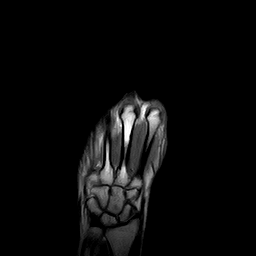
[im 12/14]
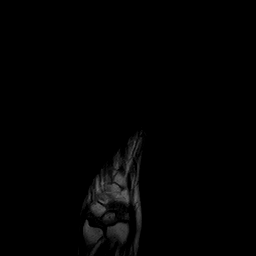
[im 14/14]
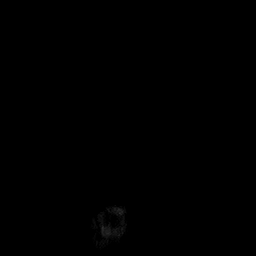

[Series 6: T2 · coronal · left · 3.0mm · 0.78mm/px · 9 of 16 slices shown]
[im 1/16]
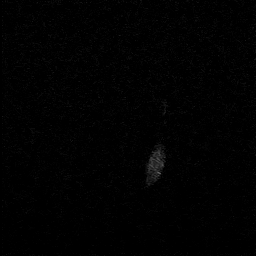
[im 2/16]
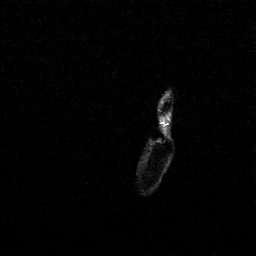
[im 4/16]
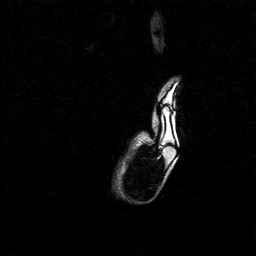
[im 6/16]
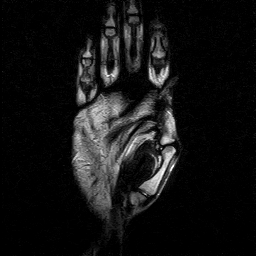
[im 8/16]
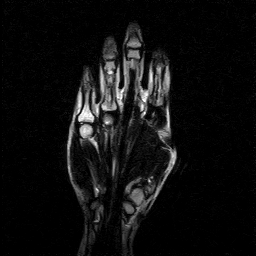
[im 10/16]
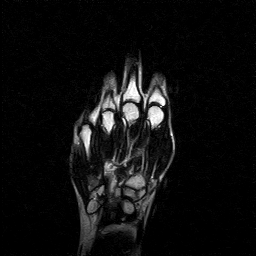
[im 12/16]
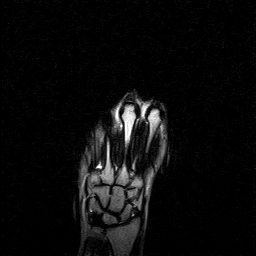
[im 14/16]
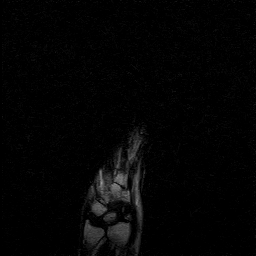
[im 16/16]
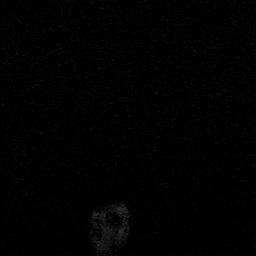

[Series 7: ir sag · sagittal · left · 3.0mm · 0.78mm/px · 10 of 17 slices shown]
[im 1/17]
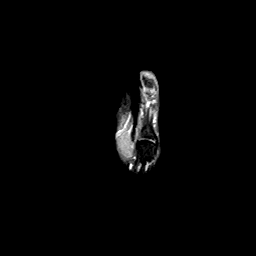
[im 2/17]
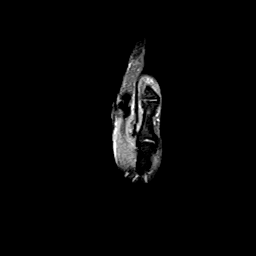
[im 4/17]
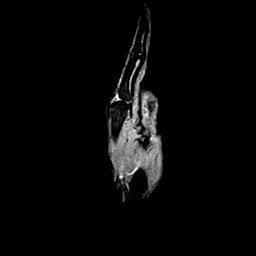
[im 6/17]
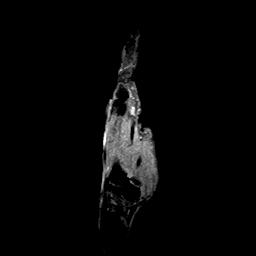
[im 8/17]
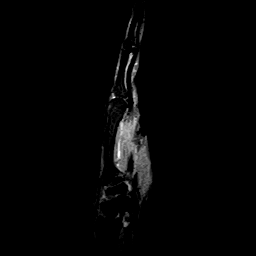
[im 9/17]
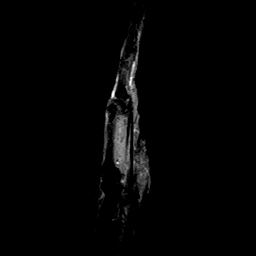
[im 11/17]
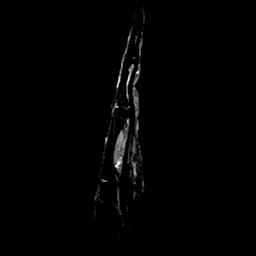
[im 13/17]
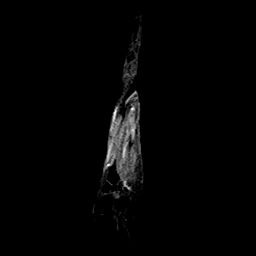
[im 15/17]
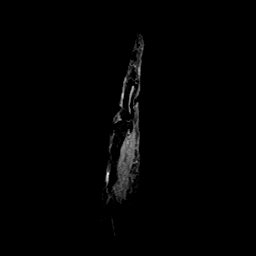
[im 17/17]
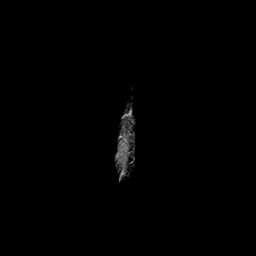

[Series 8: T1 · sagittal · left · 3.0mm · 0.78mm/px · 11 of 19 slices shown (2 of 2)]
[im 1/19]
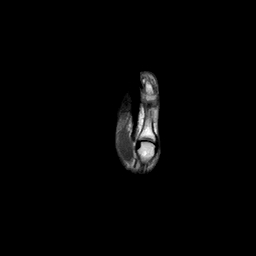
[im 2/19]
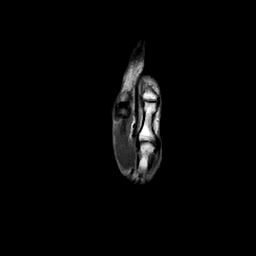
[im 4/19]
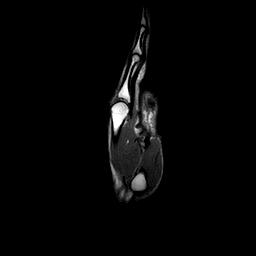
[im 6/19]
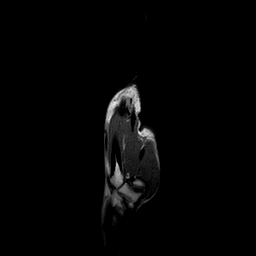
[im 8/19]
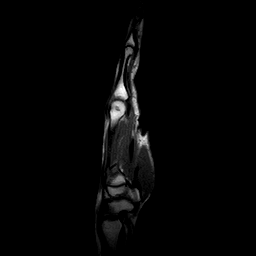
[im 10/19]
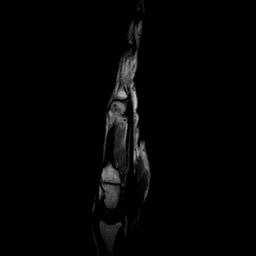
[im 11/19]
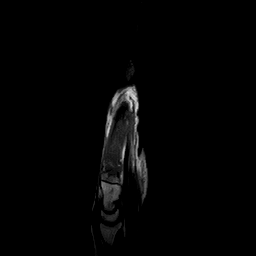
[im 13/19]
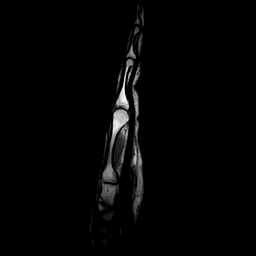
[im 15/19]
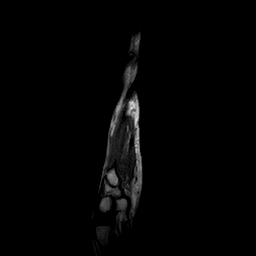
[im 17/19]
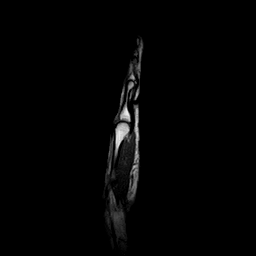
[im 19/19]
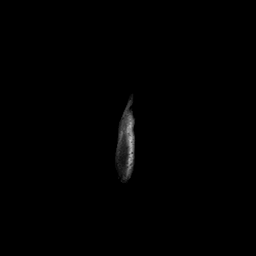

[40 of 40 positions shown; findings below may reference images not displayed]

FINDINGS: Bone:  Normal signal without evidence of fracture.  No osseous erosions.

Tendons: The extensor and flexor tendons are within normal limits.

Ligaments: Within the limitations of the examination, the scapholunate ligament, lunotriquetral ligament, and triangular fibrocartilage complex are within normal limits. 

Nerve: The median nerve demonstrate normal signal characteristics and morphology.

Other: No joint effusion or synovitis.  Imaged muscles demonstrate normal signal.
IMPRESSION: Normal MR evaluation of the left hand

## 2023-05-16 IMAGING — MR MRI FOREARM LT W/O
5 series · 40 of 40 positions shown · non-contrast
Comparison: None.

﻿MRI LEFT FOREARM WITHOUT CONTRAST
INDICATION: Left forearm/wrist pain
TECHNIQUE: MRI of the left distal forearm/wrist was performed on a low field strength open MRI scanner. Multiplanar multisequence imaging was performed including coronal T1, IR and PD; axial PD and IR; sagittal T2 sequences without contrast.

[Series 3: PD · axial · left · 5.0mm · 0.70mm/px · z∈[+28,+52]mm · 2 of 5 slices shown (1 of 2)]
[im 1/5]
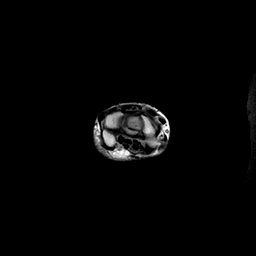
[im 5/5]
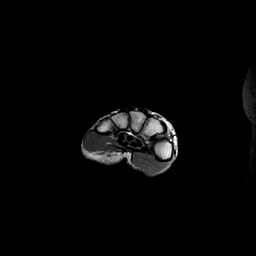

[Series 5: T1 · coronal · left · 3.5mm · 0.27mm/px · 10 of 16 slices shown]
[im 1/16]
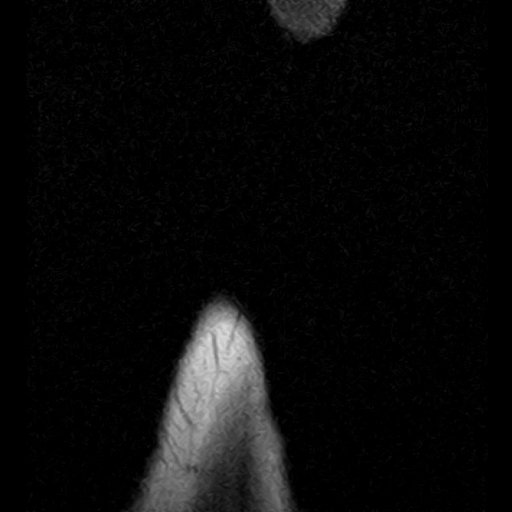
[im 2/16]
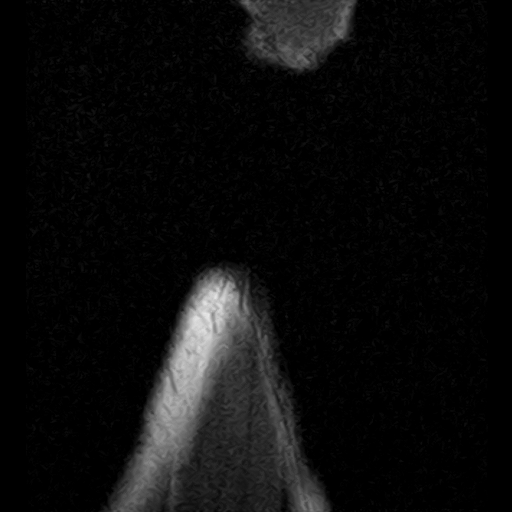
[im 4/16]
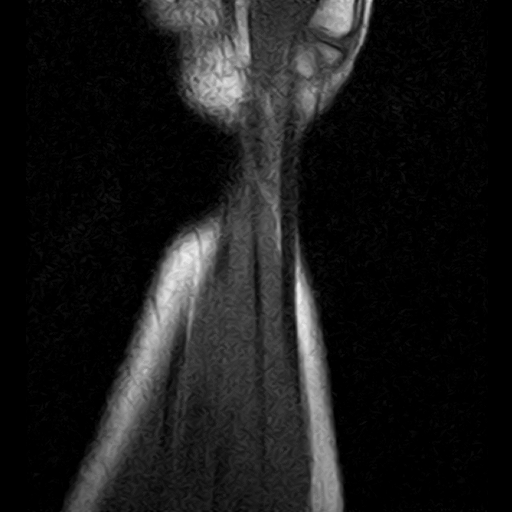
[im 6/16]
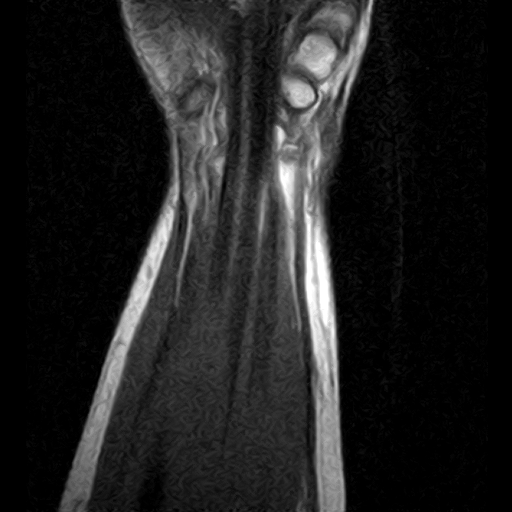
[im 7/16]
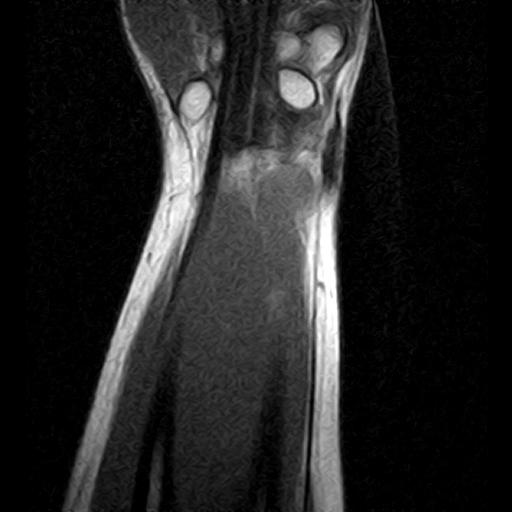
[im 9/16]
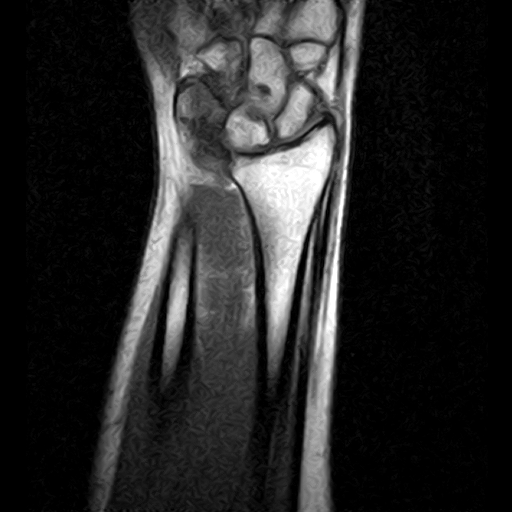
[im 11/16]
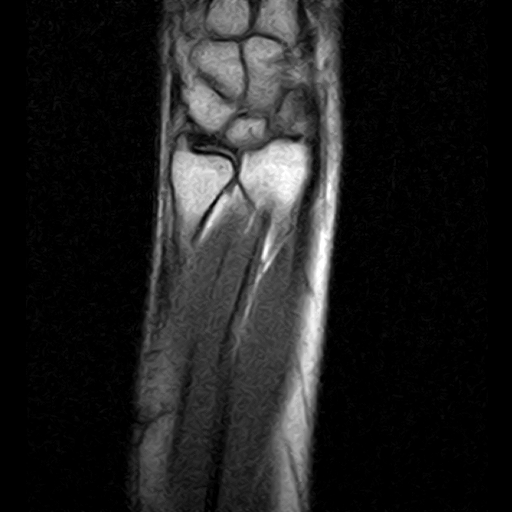
[im 12/16]
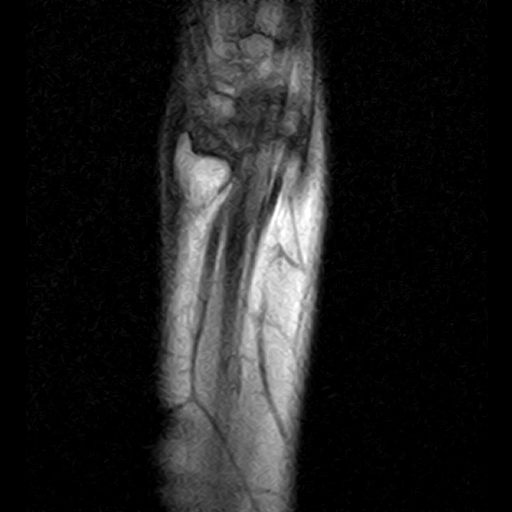
[im 14/16]
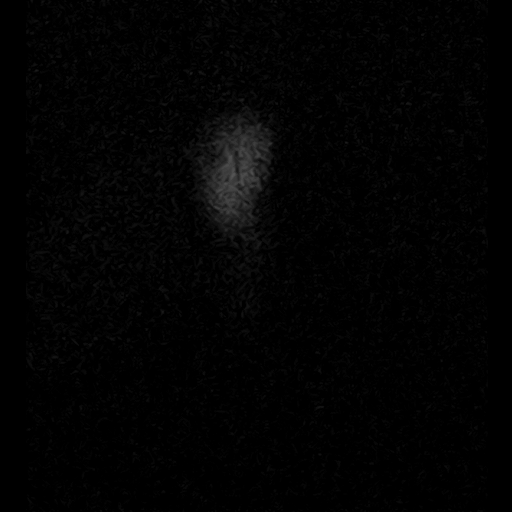
[im 16/16]
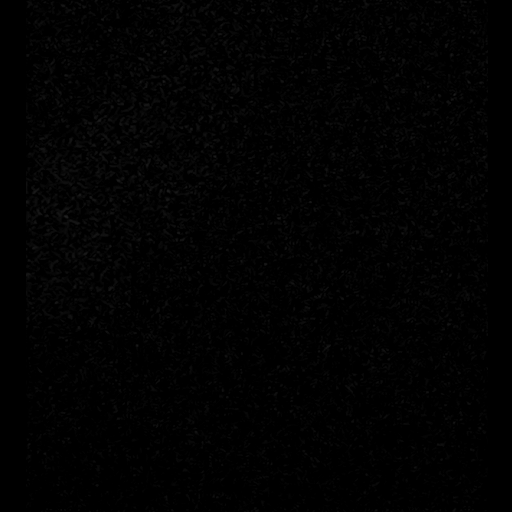

[Series 6: ir cor · coronal · left · 3.5mm · 0.55mm/px · 8 of 14 slices shown]
[im 1/14]
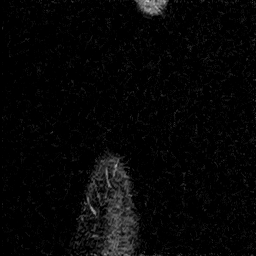
[im 2/14]
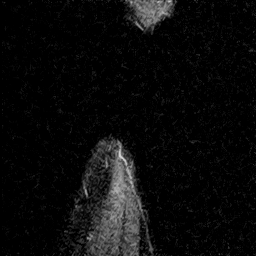
[im 4/14]
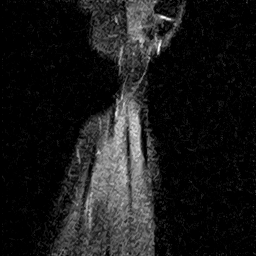
[im 6/14]
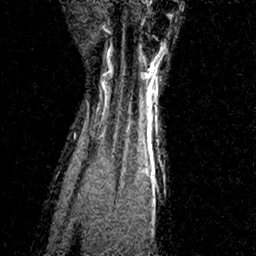
[im 8/14]
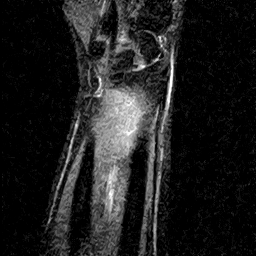
[im 10/14]
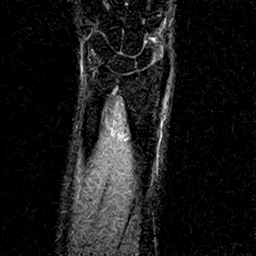
[im 12/14]
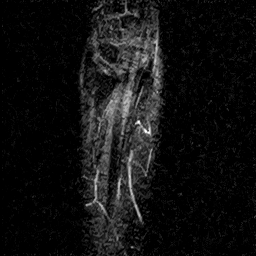
[im 14/14]
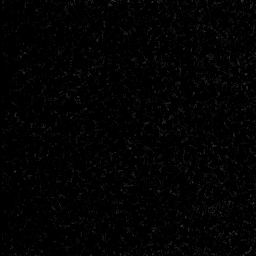

[Series 7: T2 · sagittal · left · 3.5mm · 0.62mm/px · 10 of 16 slices shown]
[im 1/16]
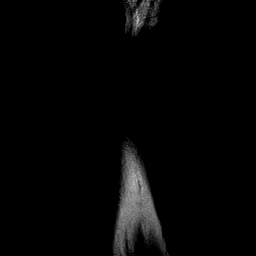
[im 2/16]
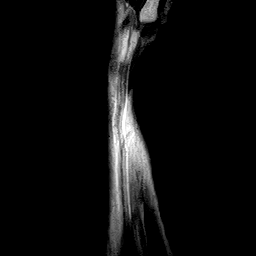
[im 4/16]
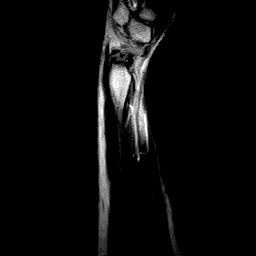
[im 6/16]
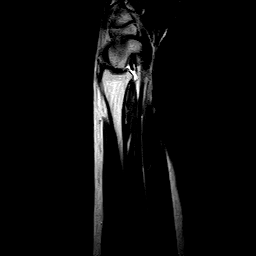
[im 7/16]
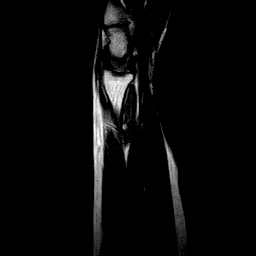
[im 9/16]
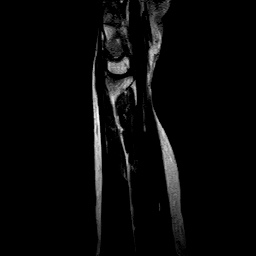
[im 11/16]
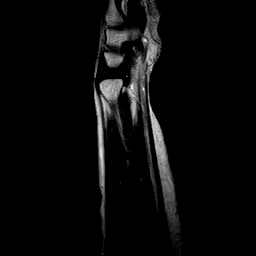
[im 12/16]
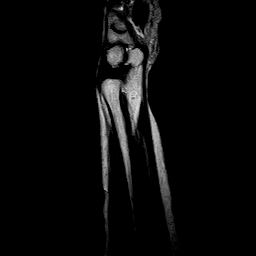
[im 14/16]
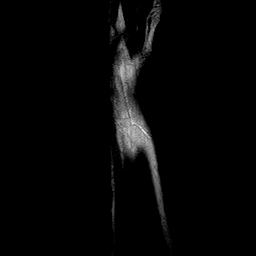
[im 16/16]
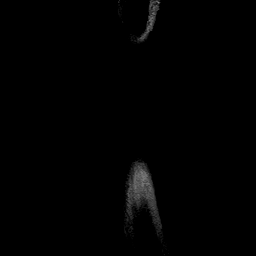

[Series 8: PD · coronal · left · 3.5mm · 0.55mm/px · 10 of 16 slices shown (2 of 2)]
[im 1/16]
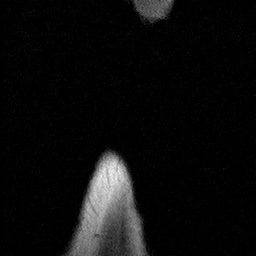
[im 2/16]
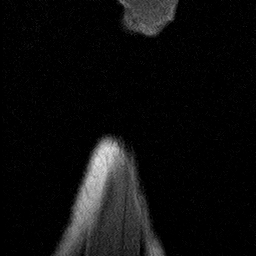
[im 4/16]
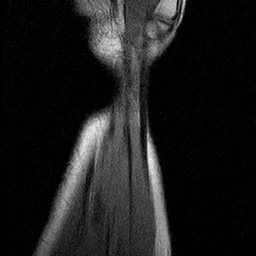
[im 6/16]
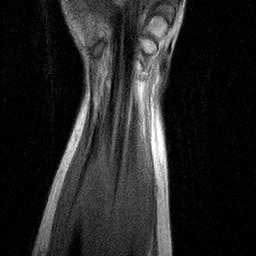
[im 7/16]
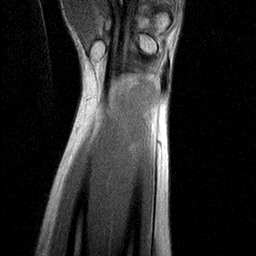
[im 9/16]
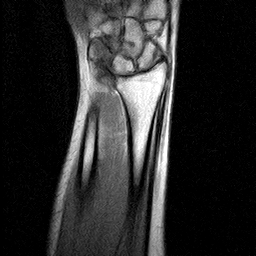
[im 11/16]
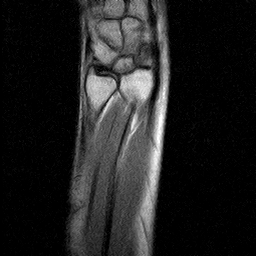
[im 12/16]
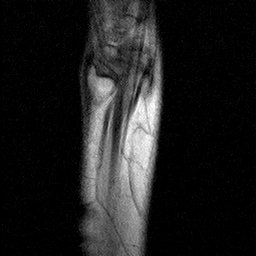
[im 14/16]
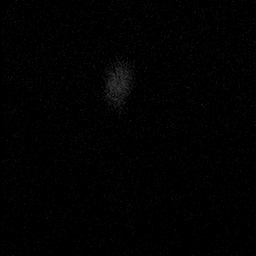
[im 16/16]
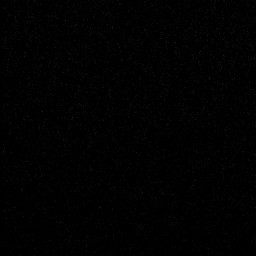

[40 of 40 positions shown; findings below may reference images not displayed]

FINDINGS: Bone:  Normal signal without evidence of fracture. 

Tendons: The extensor and flexor tendons are within normal limits.

Ligaments: Within the limitations of the examination, the scapholunate ligament, lunotriquetral ligament, and triangular fibrocartilage complex are within normal limits. 

Nerve: The median nerve demonstrates normal signal characteristics and morphology.

Other: No joint effusion or synovitis.
IMPRESSION: No MR evidence of fracture or myotendinous injury.

## 2023-05-16 IMAGING — MR LT FOREARM
3 series · 40 of 40 positions shown · non-contrast
Comparison: None.

﻿MRI LEFT FOREARM WITHOUT CONTRAST
INDICATION: Left forearm/wrist pain
TECHNIQUE: MRI of the left distal forearm/wrist was performed on a low field strength open MRI scanner. Multiplanar multisequence imaging was performed including coronal T1, IR and PD; axial PD and IR; sagittal T2 sequences without contrast.

[Series 1: scano s/t/c · axial · left · 10.0mm · 1.33mm/px · z∈[-15,+170]mm · 9 of 12 slices shown]
[im 1/12]
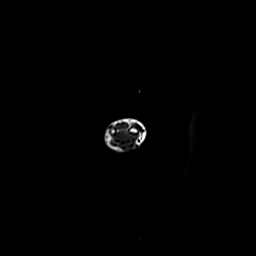
[im 2/12]
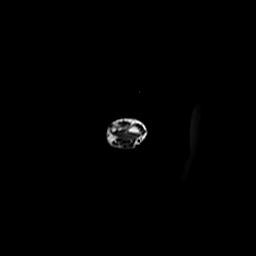
[im 3/12]
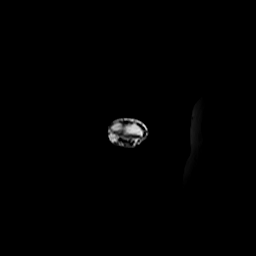
[im 5/12]
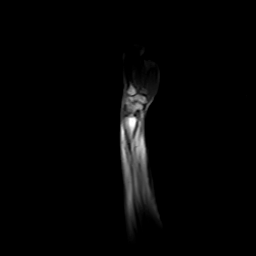
[im 6/12]
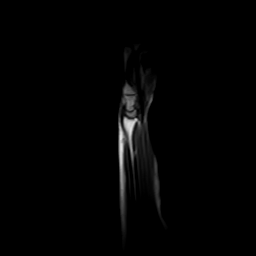
[im 7/12]
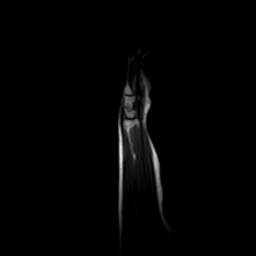
[im 9/12]
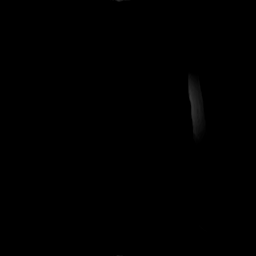
[im 10/12]
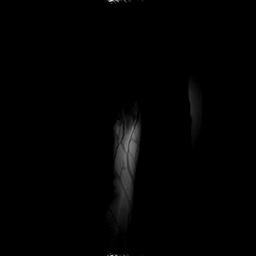
[im 12/12]
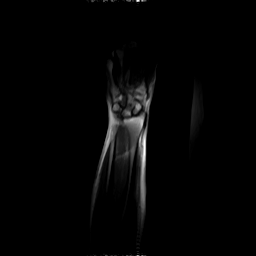

[Series 2: ir trs · axial · left · 5.0mm · 0.55mm/px · z∈[-87,+51]mm · 17 of 24 slices shown]
[im 1/24]
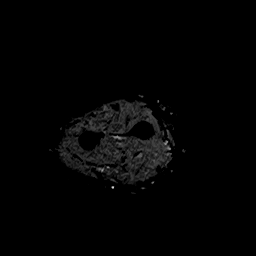
[im 2/24]
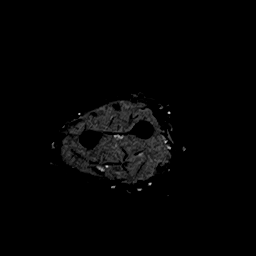
[im 3/24]
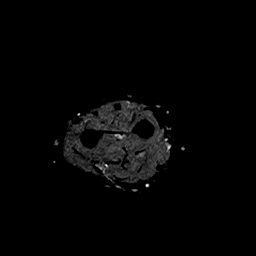
[im 5/24]
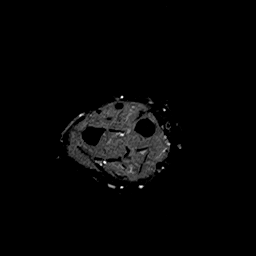
[im 6/24]
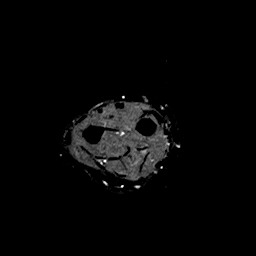
[im 8/24]
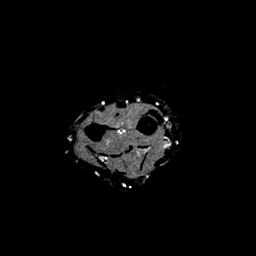
[im 9/24]
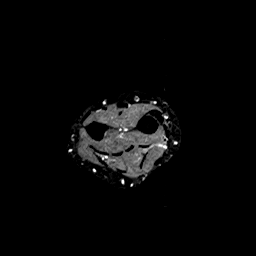
[im 11/24]
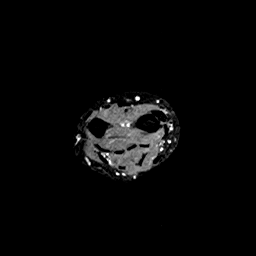
[im 12/24]
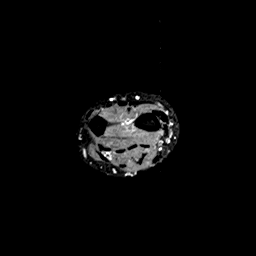
[im 13/24]
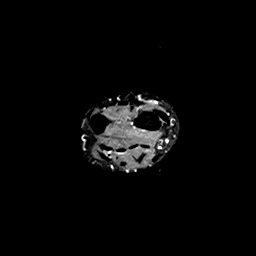
[im 15/24]
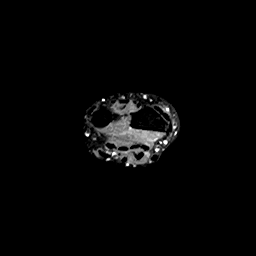
[im 16/24]
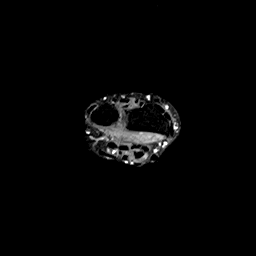
[im 18/24]
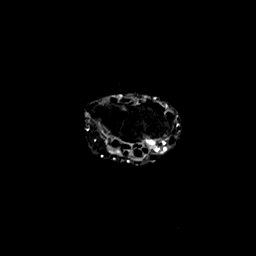
[im 19/24]
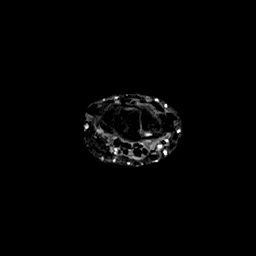
[im 21/24]
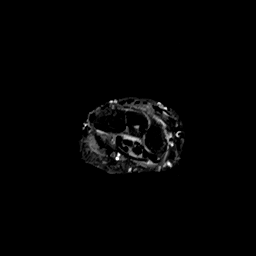
[im 22/24]
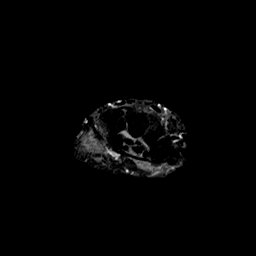
[im 24/24]
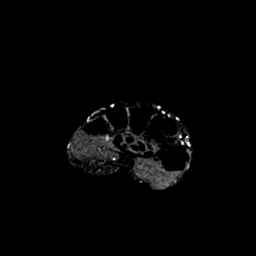

[Series 3: PD · axial · left · 5.0mm · 0.70mm/px · z∈[-86,+22]mm · 14 of 19 slices shown]
[im 1/19]
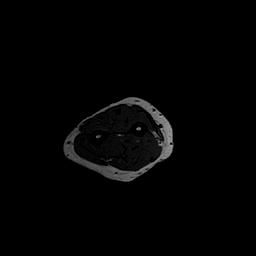
[im 2/19]
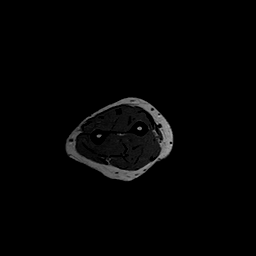
[im 3/19]
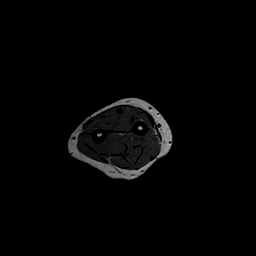
[im 5/19]
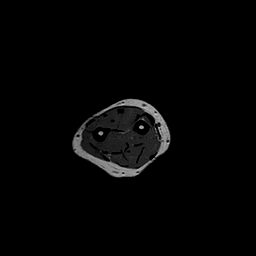
[im 6/19]
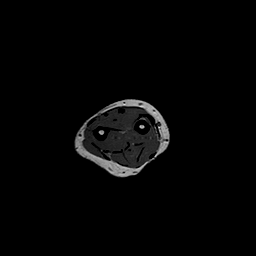
[im 7/19]
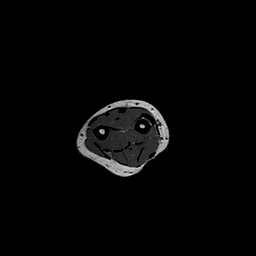
[im 9/19]
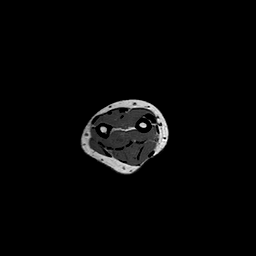
[im 10/19]
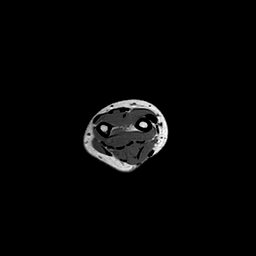
[im 12/19]
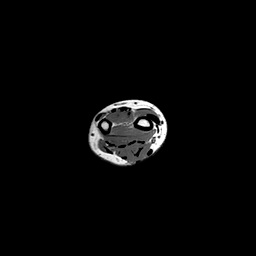
[im 13/19]
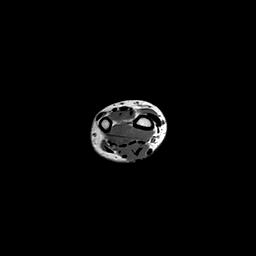
[im 14/19]
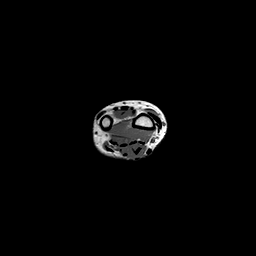
[im 16/19]
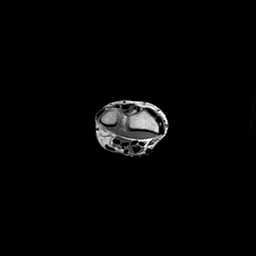
[im 17/19]
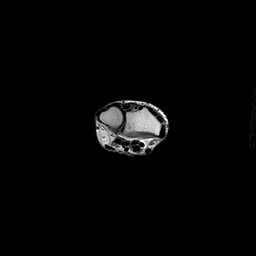
[im 19/19]
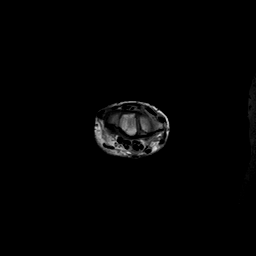

[40 of 40 positions shown; findings below may reference images not displayed]

FINDINGS: Bone:  Normal signal without evidence of fracture. 

Tendons: The extensor and flexor tendons are within normal limits.

Ligaments: Within the limitations of the examination, the scapholunate ligament, lunotriquetral ligament, and triangular fibrocartilage complex are within normal limits. 

Nerve: The median nerve demonstrate normal signal characteristics and morphology.

Other: No joint effusion or synovitis.
IMPRESSION: No MR evidence of fracture or myotendinous injury.

## 2023-05-27 IMAGING — MR MRI FOREARM RT WO CONTRAST
5 series · 40 of 40 positions shown · non-contrast
Comparison: None.

﻿MRI RIGHT FOREARM WITHOUT IV CONTRAST
INDICATION: Right forearm/wrist pain.
TECHNIQUE: MRI examination of the right forearm is performed on a low field strength open MRI scanner including coronal T1 PD and IR; sagittal PD and axial IR sequences.

[Series 3: scano s/t/c · axial · right · 10.0mm · 1.02mm/px · z∈[+15,+130]mm · 2 of 5 slices shown]
[im 1/5]
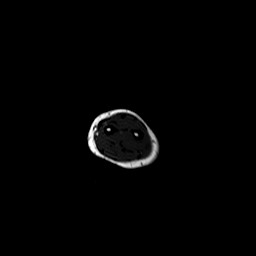
[im 5/5]
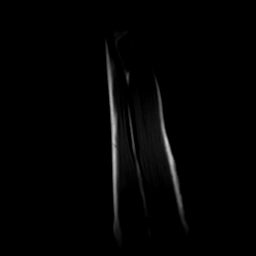

[Series 5: T1 · coronal · right · 3.5mm · 0.62mm/px · 6 of 13 slices shown]
[im 1/13]
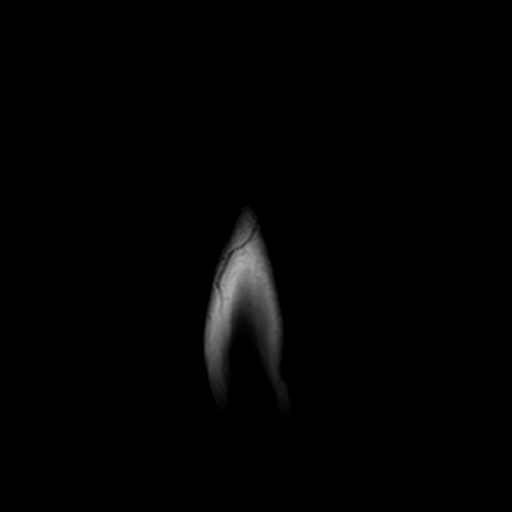
[im 3/13]
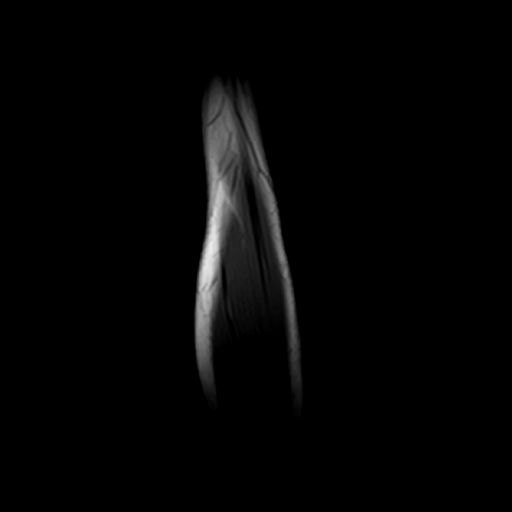
[im 5/13]
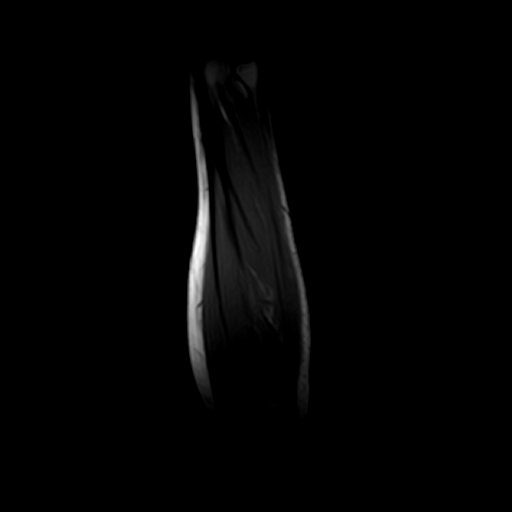
[im 8/13]
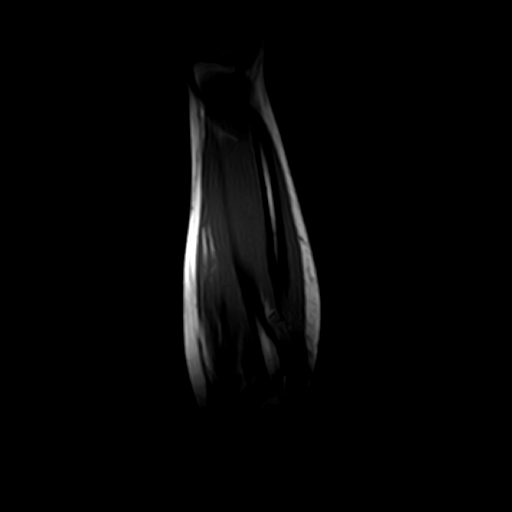
[im 10/13]
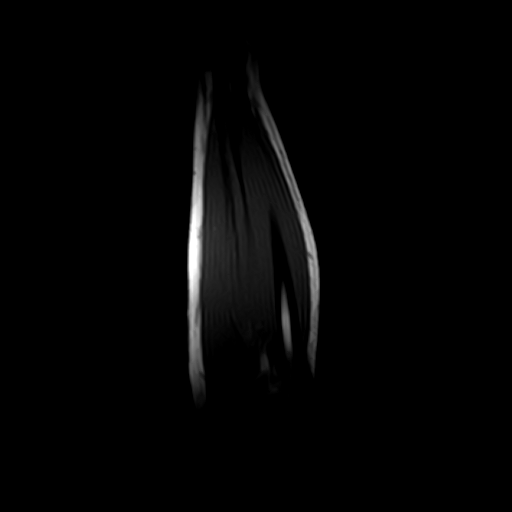
[im 13/13]
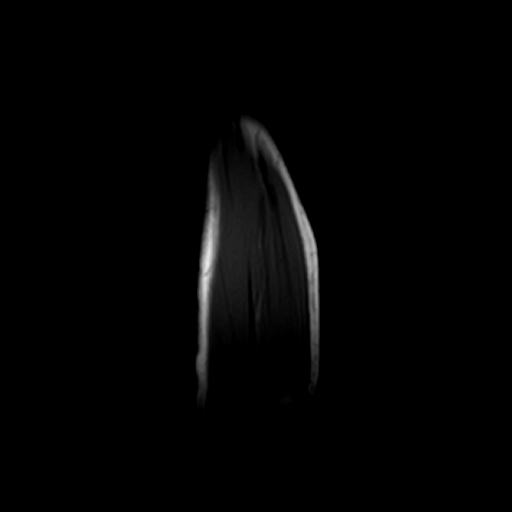

[Series 6: ir cor · coronal · right · 3.5mm · 1.25mm/px · 9 of 18 slices shown]
[im 1/18]
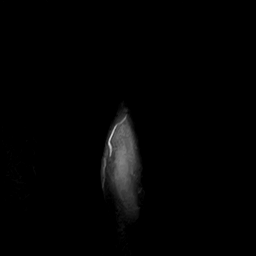
[im 3/18]
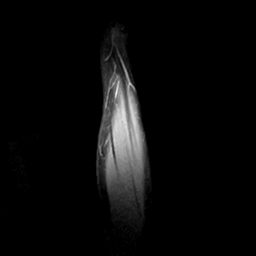
[im 5/18]
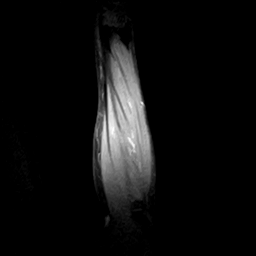
[im 7/18]
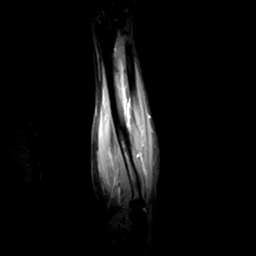
[im 9/18]
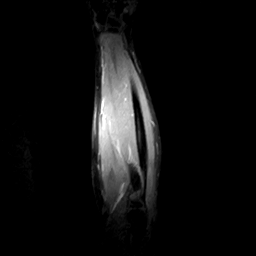
[im 11/18]
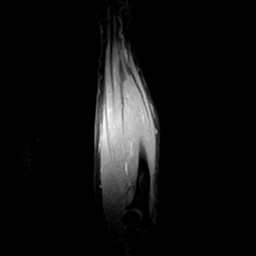
[im 13/18]
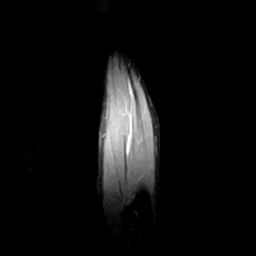
[im 15/18]
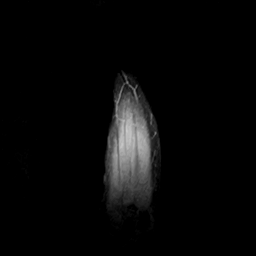
[im 18/18]
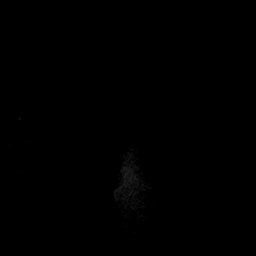

[Series 7: PD · coronal · right · 3.5mm · 1.25mm/px · 9 of 18 slices shown]
[im 1/18]
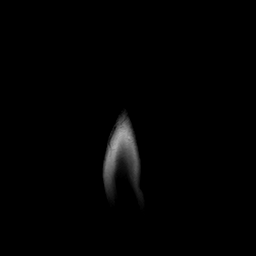
[im 3/18]
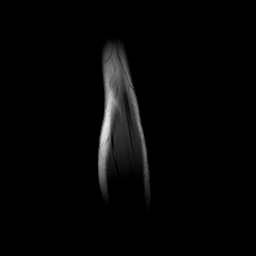
[im 5/18]
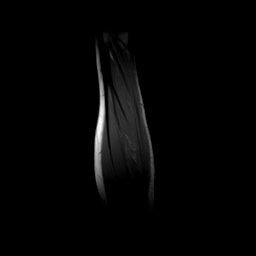
[im 7/18]
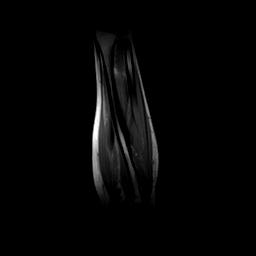
[im 9/18]
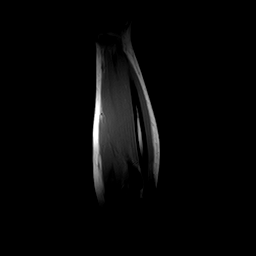
[im 11/18]
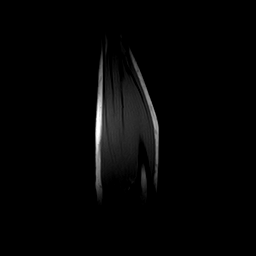
[im 13/18]
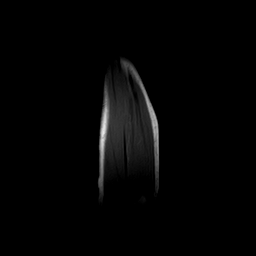
[im 15/18]
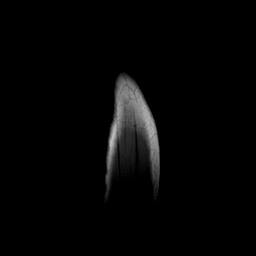
[im 18/18]
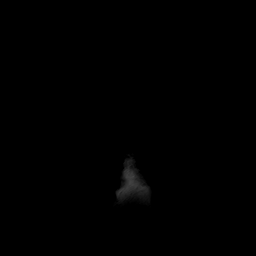

[Series 8: ir trs · axial · right · 4.0mm · 0.55mm/px · z∈[-78,+57]mm · 14 of 28 slices shown]
[im 1/28]
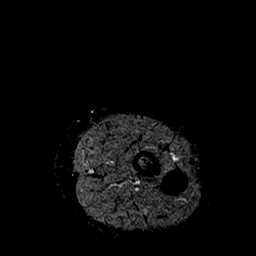
[im 3/28]
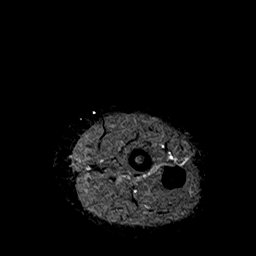
[im 5/28]
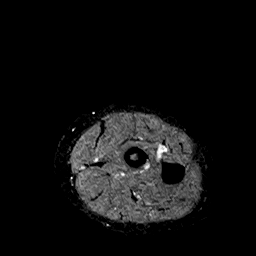
[im 7/28]
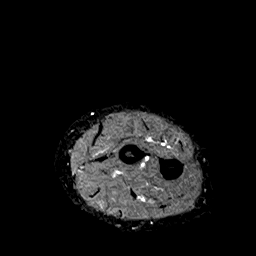
[im 9/28]
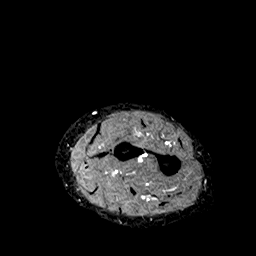
[im 11/28]
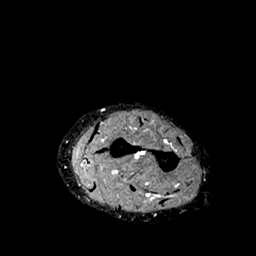
[im 13/28]
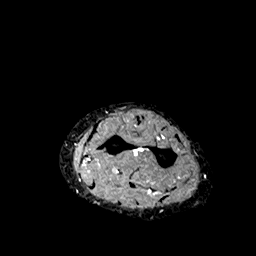
[im 15/28]
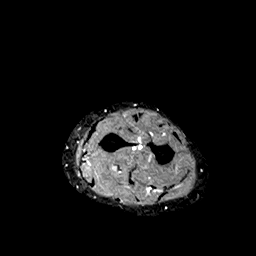
[im 17/28]
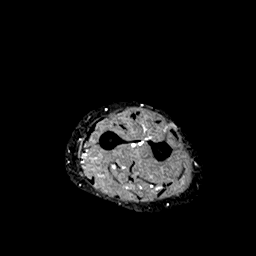
[im 19/28]
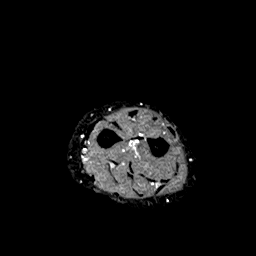
[im 21/28]
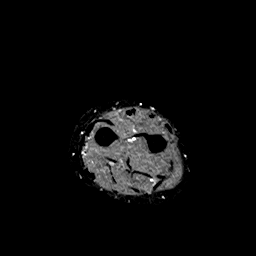
[im 23/28]
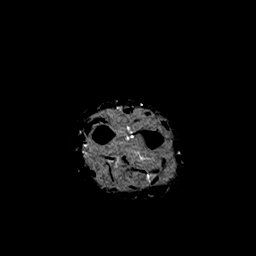
[im 25/28]
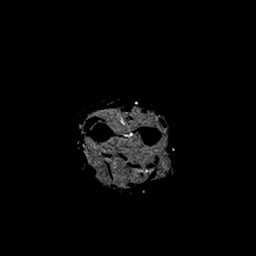
[im 28/28]
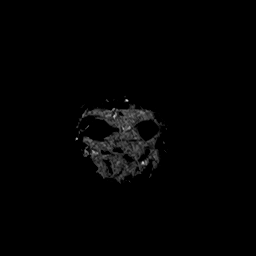

[40 of 40 positions shown; findings below may reference images not displayed]

FINDINGS: Bone:  Normal signal without evidence of fracture. 

Tendons: The extensor and flexor tendons are within normal limits.

Other:  Soft tissues are normal signal.  No evidence of acute or chronic denervation change.
IMPRESSION: No MR evidence of fracture or myotendinous injury.
